# Patient Record
Sex: Female | Born: 1980 | Race: White | Hispanic: No | Smoking: Current every day smoker
Health system: Southern US, Community
[De-identification: ages and names within clinical notes are randomized; demographics above are authoritative.]

## PROBLEM LIST (undated history)

## (undated) DIAGNOSIS — F419 Anxiety disorder, unspecified: Secondary | ICD-10-CM

## (undated) DIAGNOSIS — E119 Type 2 diabetes mellitus without complications: Secondary | ICD-10-CM

## (undated) DIAGNOSIS — E7800 Pure hypercholesterolemia, unspecified: Secondary | ICD-10-CM

## (undated) HISTORY — DX: Pure hypercholesterolemia, unspecified: E78.00

## (undated) HISTORY — DX: Anxiety disorder, unspecified: F41.9

## (undated) HISTORY — DX: Type 2 diabetes mellitus without complications: E11.9

---

## 2008-04-03 HISTORY — PX: DILATION AND CURETTAGE OF UTERUS: SHX78

## 2009-04-03 DIAGNOSIS — IMO0002 Reserved for concepts with insufficient information to code with codable children: Secondary | ICD-10-CM

## 2010-04-03 HISTORY — PX: WISDOM TOOTH EXTRACTION: SHX21

## 2015-05-26 ENCOUNTER — Ambulatory Visit (INDEPENDENT_AMBULATORY_CARE_PROVIDER_SITE_OTHER): Payer: Medicaid Other | Admitting: Obstetrics and Gynecology

## 2015-05-26 ENCOUNTER — Encounter: Payer: Self-pay | Admitting: Obstetrics and Gynecology

## 2015-05-26 VITALS — BP 115/74 | HR 102 | Ht 62.0 in | Wt 183.3 lb

## 2015-05-26 DIAGNOSIS — Z36 Encounter for antenatal screening of mother: Secondary | ICD-10-CM | POA: Diagnosis not present

## 2015-05-26 DIAGNOSIS — E669 Obesity, unspecified: Secondary | ICD-10-CM

## 2015-05-26 DIAGNOSIS — Z98891 History of uterine scar from previous surgery: Secondary | ICD-10-CM | POA: Insufficient documentation

## 2015-05-26 DIAGNOSIS — E1110 Type 2 diabetes mellitus with ketoacidosis without coma: Secondary | ICD-10-CM | POA: Insufficient documentation

## 2015-05-26 DIAGNOSIS — Z720 Tobacco use: Secondary | ICD-10-CM

## 2015-05-26 DIAGNOSIS — Z3687 Encounter for antenatal screening for uncertain dates: Secondary | ICD-10-CM

## 2015-05-26 DIAGNOSIS — Z3490 Encounter for supervision of normal pregnancy, unspecified, unspecified trimester: Secondary | ICD-10-CM

## 2015-05-26 DIAGNOSIS — E1165 Type 2 diabetes mellitus with hyperglycemia: Secondary | ICD-10-CM | POA: Diagnosis not present

## 2015-05-26 DIAGNOSIS — IMO0001 Reserved for inherently not codable concepts without codable children: Secondary | ICD-10-CM

## 2015-05-26 DIAGNOSIS — N926 Irregular menstruation, unspecified: Secondary | ICD-10-CM | POA: Diagnosis not present

## 2015-05-26 DIAGNOSIS — E109 Type 1 diabetes mellitus without complications: Secondary | ICD-10-CM

## 2015-05-26 DIAGNOSIS — Z331 Pregnant state, incidental: Secondary | ICD-10-CM

## 2015-05-26 LAB — POCT URINE PREGNANCY: Preg Test, Ur: POSITIVE — AB

## 2015-05-26 NOTE — Progress Notes (Signed)
Chief complaint: 1. Pregnancy confirmation  The patient is a 35 year old single white female gravida 3 para 1011, uncertain last menstrual period 03/31/2015 (light), EDD 01/05/2016, EGA 8.0 weeks by uncertain LMP, presents for pregnancy confirmation and establishment of care.  Patient was not attempting conception; using condoms for birth control. Pregnancy is unplanned but desired. Father of baby is supportive; father of baby of first child is deceased.  Past obstetric history: G1- SAB requiring D&C G2-primary low transverse C-section due to nonreassuring fetal heart rate tracing; patient gained 100 pounds with that pregnancy; delivery was in [place] G3-current  Patient was diagnosed with the insulin requiring diabetes mellitus at age 48. Current dosing regimen is Novolin 70/3060 units per day; Novolin R 20-40 units per day with meals; blood sugars have been running in the low 300s   Past Medical History  Diagnosis Date  . Diabetes mellitus without complication (HCC)   . Hypercholesteremia   . Anxiety    Past Surgical History  Procedure Laterality Date  . Dilation and curettage of uterus  2010  . Cesarean section  2011  . Wisdom tooth extraction  2012    Family history: No history of colon cancer, ovarian cancer, breast cancer No history of genetic abnormalities in family No history of genetic abnormalities in father of baby family  Social history: Tobacco use 1 pack per day; decreased to one half pack per day following positive home pregnancy test Alcohol use- rare when nonpregnant Drug history-negative  OBJECTIVE: BP 115/74 mmHg  Pulse 102  Ht  (1.575 m)  Wt 183 lb 4.8 oz (83.144 kg)  BMI 33.52 kg/m2  LMP 03/31/2015 (Approximate) Physical exam deferred  ASSESSMENT: 1. Pregnancy, first trimester, unsure last menstrual period 2. History of cesarean section delivery for nonreassuring fetal heart rate tracing 3. Type 1 diabetes mellitus with uncontrolled  blood sugars; no recent hemoglobin A1c 4. Obesity 5. Tobacco user  PLAN: 1. Ultrasound ASAP to confirm viability and EDD 2. Pattern blood sugars daily; continue with insulin regimen until MFM consultation 3. Hemoglobin A1c today 4. Continue with decreasing tobacco use; pregnancy risks of tobacco use were reviewed 5. Maternal-fetal medicine consult ASAP following confirmation of fetal viability in order to optimize insulin therapy in pregnancy and co manage prenatal course. 6. Return in 2 weeks for new OB nursing intake, prenatal labs ([redacted] weeks gestation) 7. Return in approximate 4 weeks for new OB history and physical with provider ([redacted] weeks gestation) 8.New OB counseling:  The patient has been given an overview regarding routine prenatal care.  Recommendations regarding diet, weight gain, and exercise in pregnancy were given.  Prenatal testing, optional genetic testing, and ultrasound use in pregnancy were reviewed.   Benefits of Breast Feeding were discussed. The patient is encouraged to consider nursing her baby post partum. 9. Prenatal vitamins daily  A total of 30 minutes were spent face-to-face with the patient during the encounter with greater than 50% dealing with counseling and coordination of care.  Herold Harms, MD  Note: This dictation was prepared with Dragon dictation along with smaller phrase technology. Any transcriptional errors that result from this process are unintentional.

## 2015-05-26 NOTE — Patient Instructions (Signed)
PLAN: 1. Ultrasound ASAP to confirm viability and EDD 2. Pattern blood sugars daily; continue with insulin regimen until MFM consultation 3. Hemoglobin A1c today 4. Continue with decreasing tobacco use; pregnancy risks of tobacco use were reviewed 5. Maternal-fetal medicine consult ASAP following confirmation of fetal viability in order to optimize insulin therapy in pregnancy and co manage prenatal course. 6. Return in 2 weeks for new OB nursing intake, prenatal labs ([redacted] weeks gestation) 7. Return in approximate 4 weeks for new OB history and physical with provider ([redacted] weeks gestation) 8.New OB counseling:  The patient has been given an overview regarding routine prenatal care.  Recommendations regarding diet, weight gain, and exercise in pregnancy were given.  Prenatal testing, optional genetic testing, and ultrasound use in pregnancy were reviewed.   Benefits of Breast Feeding were discussed. The patient is encouraged to consider nursing her baby post partum. 9. Prenatal vitamins daily

## 2015-06-01 ENCOUNTER — Other Ambulatory Visit: Payer: Medicaid Other

## 2015-06-01 ENCOUNTER — Ambulatory Visit (INDEPENDENT_AMBULATORY_CARE_PROVIDER_SITE_OTHER): Payer: Medicaid Other

## 2015-06-01 DIAGNOSIS — Z36 Encounter for antenatal screening of mother: Secondary | ICD-10-CM | POA: Diagnosis not present

## 2015-06-01 DIAGNOSIS — Z3687 Encounter for antenatal screening for uncertain dates: Secondary | ICD-10-CM

## 2015-06-01 DIAGNOSIS — O24911 Unspecified diabetes mellitus in pregnancy, first trimester: Secondary | ICD-10-CM

## 2015-06-02 LAB — HEMOGLOBIN A1C
Est. average glucose Bld gHb Est-mCnc: 240 mg/dL
Hgb A1c MFr Bld: 10 % — ABNORMAL HIGH (ref 4.8–5.6)

## 2015-06-03 ENCOUNTER — Other Ambulatory Visit: Payer: Self-pay

## 2015-06-03 ENCOUNTER — Ambulatory Visit
Admission: RE | Admit: 2015-06-03 | Discharge: 2015-06-03 | Disposition: A | Discharge status: Home / Self Care | Payer: Medicaid Other | Source: Ambulatory Visit | Attending: Obstetrics and Gynecology | Admitting: Obstetrics and Gynecology

## 2015-06-03 VITALS — BP 128/71 | HR 91 | Temp 98.3°F | Resp 18 | Wt 182.6 lb

## 2015-06-03 DIAGNOSIS — Z3A09 9 weeks gestation of pregnancy: Secondary | ICD-10-CM | POA: Insufficient documentation

## 2015-06-03 DIAGNOSIS — O24011 Pre-existing diabetes mellitus, type 1, in pregnancy, first trimester: Secondary | ICD-10-CM | POA: Insufficient documentation

## 2015-06-03 DIAGNOSIS — Z79899 Other long term (current) drug therapy: Secondary | ICD-10-CM | POA: Insufficient documentation

## 2015-06-03 DIAGNOSIS — E1010 Type 1 diabetes mellitus with ketoacidosis without coma: Secondary | ICD-10-CM | POA: Diagnosis not present

## 2015-06-03 DIAGNOSIS — O09529 Supervision of elderly multigravida, unspecified trimester: Secondary | ICD-10-CM | POA: Insufficient documentation

## 2015-06-03 DIAGNOSIS — O09521 Supervision of elderly multigravida, first trimester: Secondary | ICD-10-CM

## 2015-06-03 DIAGNOSIS — E10311 Type 1 diabetes mellitus with unspecified diabetic retinopathy with macular edema: Secondary | ICD-10-CM

## 2015-06-03 DIAGNOSIS — Z794 Long term (current) use of insulin: Secondary | ICD-10-CM | POA: Insufficient documentation

## 2015-06-03 DIAGNOSIS — O24911 Unspecified diabetes mellitus in pregnancy, first trimester: Secondary | ICD-10-CM

## 2015-06-03 DIAGNOSIS — O99331 Smoking (tobacco) complicating pregnancy, first trimester: Secondary | ICD-10-CM | POA: Insufficient documentation

## 2015-06-03 LAB — COMPREHENSIVE METABOLIC PANEL
ALT: 15 U/L (ref 14–54)
AST: 15 U/L (ref 15–41)
Albumin: 4.1 g/dL (ref 3.5–5.0)
Alkaline Phosphatase: 90 U/L (ref 38–126)
Anion gap: 8 (ref 5–15)
BUN: 9 mg/dL (ref 6–20)
CHLORIDE: 105 mmol/L (ref 101–111)
CO2: 23 mmol/L (ref 22–32)
CREATININE: 0.48 mg/dL (ref 0.44–1.00)
Calcium: 8.7 mg/dL — ABNORMAL LOW (ref 8.9–10.3)
GFR calc non Af Amer: >60 mL/min (ref >60)
Glucose, Bld: 130 mg/dL — ABNORMAL HIGH (ref 65–99)
Potassium: 3.7 mmol/L (ref 3.5–5.1)
SODIUM: 136 mmol/L (ref 135–145)
Total Bilirubin: 0.6 mg/dL (ref 0.3–1.2)
Total Protein: 7 g/dL (ref 6.5–8.1)

## 2015-06-03 LAB — PROTEIN / CREATININE RATIO, URINE
Creatinine, Urine: 159 mg/dL
PROTEIN CREATININE RATIO: 0.08 mg/mg{creat} (ref 0.00–0.15)
Total Protein, Urine: 12 mg/dL

## 2015-06-03 LAB — CBC
HEMATOCRIT: 39.7 % (ref 35.0–47.0)
Hemoglobin: 13.8 g/dL (ref 12.0–16.0)
MCH: 31.1 pg (ref 26.0–34.0)
MCHC: 34.7 g/dL (ref 32.0–36.0)
MCV: 89.6 fL (ref 80.0–100.0)
PLATELETS: 320 10*3/uL (ref 150–440)
RBC: 4.43 MIL/uL (ref 3.80–5.20)
RDW: 13.1 % (ref 11.5–14.5)
WBC: 10.6 10*3/uL (ref 3.6–11.0)

## 2015-06-03 LAB — DIFFERENTIAL
BASOS PCT: 0 %
Basophils Absolute: 0 10*3/uL (ref 0–0.1)
EOS ABS: 0.2 10*3/uL (ref 0–0.7)
EOS PCT: 2 %
Lymphocytes Relative: 37 %
Lymphs Abs: 4 10*3/uL — ABNORMAL HIGH (ref 1.0–3.6)
MONO ABS: 0.6 10*3/uL (ref 0.2–0.9)
MONOS PCT: 6 %
Neutro Abs: 5.8 10*3/uL (ref 1.4–6.5)
Neutrophils Relative %: 55 %

## 2015-06-03 MED ORDER — INSULIN LISPRO 100 UNIT/ML ~~LOC~~ SOLN: 10.0000 [IU] | Freq: Three times a day (TID) | SUBCUTANEOUS | Status: DC

## 2015-06-03 MED ORDER — INSULIN GLARGINE 100 UNIT/ML ~~LOC~~ SOLN: 40.0000 [IU] | Freq: Every day | SUBCUTANEOUS | Status: DC

## 2015-06-03 MED ORDER — GLUCOSE BLOOD VI STRP: ORAL_STRIP | Status: DC

## 2015-06-03 NOTE — Progress Notes (Signed)
[facility] Consultation   Chief Complaint: Diabetes in pregnancy  HPI: Danielle Marks is a 35 y.o. G3P0011 at [id number] [facility]15[facility]ertain LMP) who presents in consultation from [facility] for recommendations regarding diabetes management.  Danielle Marks had diabetes since age [age]ut has not had regular care and only recently was started on Novolin 70/30 and Regular.  She has never seen an endocrinologist and her last A1C was a few years ago.  [place]1C at first prenatal appointment was 10.0.  She has not been taking her BS regularly.  She needs more test strips.  Past Medical History: Patient  has a past medical history of Diabetes mellitus without complication ([facility] Hypercholesteremia; and Anxiety. She denies having had Chicken pox. Past Surgical History: She  has past surgical history that includes Dilation and curettage of uterus (2010); Cesarean section (2011); and Wisdom tooth extraction (2012).   Obstetric History:  OB History    Gravida Para Term Preterm AB TAB SAB Ectopic Multiple Living   _0 2010 9wk miscarriage, D&C 2011 full term delivery via c/s for nonreassuring fetal status, female, 7#5oz, required 2wk NICU stay due to infection and feeding issues.  Gained 100# this pregnancy!  Was on insulin but can't remember regimen. 2017 current First 2 pregnancies were with her ex-husband  Gynecologic History:  Patient's last menstrual period was 03/31/2015 (approximate).   Hx of abnormal pap smears: no Last pap smear patient does not recall results of last pap and patient does not recall when last pap was (she has not had her NOB visit yet). She denies any STDs.  Medications:  Current Outpatient Prescriptions on File Prior to Encounter  Medication Sig Dispense Refill  . acetaminophen (TYLENOL) 325 MG tablet Take 650 mg by mouth every 6 (six) hours as  needed.    . insulin NPH-regular Human (NOVOLIN 70/30) (70-30) 100 UNIT/ML injection Inject 30 Units into the skin 2 (two) times daily with a meal.     . insulin regular (NOVOLIN R) 100 units/mL injection Inject into the skin 3 (three) times daily before meals.    . prenatal vitamin w/FE, FA (PRENATAL 1 + 1) 27-1 MG TABS tablet Take 1 tablet by mouth daily at 12 noon.      Allergies: Patient is allergic to ciprofloxacin. (anaphylaxis) Social History: Patient  reports that she has been smoking Cigarettes.  She has been smoking about 0.50 packs per day. She does not have any smokeless tobacco history on file. She reports that she does not drink alcohol or use illicit drugs.  Family History: family history includes Diabetes in her paternal grandmother. There is no history of Cancer or Heart disease. Father died by suicide, Mother has alcoholism, Sister with anorexia. Review of Systems A full 12 point review of systems was negative or as noted in the History of Present Illness.  Physical Exam: BP 128/71 mmHg  Pulse 91  Temp(Src) 98.3 F (36.8 C) (Oral)  Resp 18  Wt 182 lb 9.6 oz (82.827 kg)  SpO2 98%  LMP 03/31/2015 (Approximate) General appearance - alert, well appearing, and in no distress Fasting BS was 177 this am   Asessement: 1 Diabetes, poorly controlled, with A1C of 10  2. Elderly multigravida with antepartum condition or complication, first trimester   3.      Smoker -  84yrhistory, currently at 1/2 PPD 4.      Never had chicken pox  Plan:  We spent 30 minutes with Ms. Danielle Shankleof which more than 50% was counseling and coordinating care. We discussed the effects of her diabetes on the pregnancy and the fetus as well as the pregnancy effects on her diabetes. We reviewed the increased risk of congenital anomalies, fetal growth disturbances, fetal death, and neonatal metabolic complications and the importance of good glucose control to minimize these risks. Smoking increases  all of these complications.  We discussed increasing insulin requirements with advancing pregnancy and the need for frequent adjustment of insulin dosages to meet these needs. We cautioned her about over control and the need to avoid hypoglycemia. She was referred to [facility] for a 2100-2200 calorie meal plan and information about carbohydrate and protein requirements in pregnancy.   I prescribed her a Lantus/Humalog regimen and we discussed the importance of regular meals and bedtime snack.  Will start 40U Lantus qhs and 10U Humalog with meals.  She is aware that this is likely an underestimate based on her current 70/30 and R regimen and will need to be increased steadily until her goals (see below) are met.    The goals of her insulin regimen is to have: 1. Fasting a capillary blood glucose between 60 and 95 mg/dl; 2. Two hour postprandial capillary blood glucoses less than 120 mg/dl.  In addition, we have the following recommendations:  1. Baseline EKG; 2. Protein/creatinine ratio, CMP, TFTs and cbc; 3. Ophthalmological examination; 4. Baby ASA 5. First trimester fetal assessment of anatomy and nuchal translucency area; 6. Fetal anatomic survey between 16-[redacted] weeks gestation (Please let [place] if you would like [place]ato schedule this.); 7 Fetal cardiac evaluation between 22-[redacted] weeks gestation;  8 Ultrasound for fetal growth every 4 weeks beginning at [redacted] weeks gestation;  9. Twice weekly nonstress tests beginning at [redacted] weeks gestation  10. Delivery planning will be determined once the patient is closer to her estimated due date. This will be determined by:    A. Fetal size;    B. Amniotic fluid volume;    C. Level of glucose control;    D. Other medical or obstetrical complications.  She was offered and declined formal genetic counseling but [facility] would be a candidate for first trimester screening or cell free fetal DNA as well as diagnostic procedures (CVS or  amniocentesis) if desired.  She was strongly encouraged to cut down or quit, if possible, smoking given the concomitant risks of smoking and diabetes on both maternal and fetal health.  Consider checking for prior exposure to Varicella with NOB labs.  I have taken the liberty of scheduling her a follow up consult in 1 week to review her BS as well as a 12 week first trimester anatomy [place]  If it is preferred that this if performed in the office, please feel free to cancel the latter appointment.  We appreciate the opportunity to take part in the care of Danielle Marks. Please don't hesitate to contact u[place]ith any questions.  SWynona Neat MD Maternal-Fetal Medicine

## 2015-06-03 NOTE — Progress Notes (Signed)
Patient called to state that the Lantus/Humalog was too expensive to get filled.  Should have Pregnancy Medicaid within the week and will retry to fill it then.  In the meantime, will increase her 70/30 to 34 in the am and 34 in the pm and she was encouraged to check in BS 4x daily so that we can make appropriate adjustments.  Referral to [facility] was placed.  Has f/up scheduled in 1 week to review labs and BS log.

## 2015-06-03 NOTE — Addendum Note (Signed)
Encounter addended by: Kirby Funk, MD on: 06/03/2015  4:20 PM<BR>     Documentation filed: Notes Section

## 2015-06-04 LAB — THYROID PANEL WITH TSH
FREE THYROXINE INDEX: 1.8 (ref 1.2–4.9)
T3 UPTAKE RATIO: 23 % — AB (ref 24–39)
T4 TOTAL: 7.8 ug/dL (ref 4.5–12.0)
TSH: 0.737 u[IU]/mL (ref 0.450–4.500)

## 2015-06-07 ENCOUNTER — Telehealth: Payer: Self-pay

## 2015-06-10 ENCOUNTER — Other Ambulatory Visit: Payer: Self-pay | Admitting: Obstetrics & Gynecology

## 2015-06-10 ENCOUNTER — Ambulatory Visit (INDEPENDENT_AMBULATORY_CARE_PROVIDER_SITE_OTHER): Payer: Medicaid Other | Admitting: Obstetrics and Gynecology

## 2015-06-10 ENCOUNTER — Ambulatory Visit: Payer: Self-pay

## 2015-06-10 ENCOUNTER — Inpatient Hospital Stay: Admission: RE | Admit: 2015-06-10 | Payer: Self-pay | Source: Ambulatory Visit

## 2015-06-10 VITALS — BP 110/80 | HR 97 | Wt 186.2 lb

## 2015-06-10 DIAGNOSIS — Z113 Encounter for screening for infections with a predominantly sexual mode of transmission: Secondary | ICD-10-CM

## 2015-06-10 DIAGNOSIS — O09521 Supervision of elderly multigravida, first trimester: Secondary | ICD-10-CM

## 2015-06-10 DIAGNOSIS — Z331 Pregnant state, incidental: Secondary | ICD-10-CM

## 2015-06-10 DIAGNOSIS — E10311 Type 1 diabetes mellitus with unspecified diabetic retinopathy with macular edema: Secondary | ICD-10-CM

## 2015-06-10 DIAGNOSIS — Z1389 Encounter for screening for other disorder: Secondary | ICD-10-CM

## 2015-06-10 DIAGNOSIS — Z369 Encounter for antenatal screening, unspecified: Secondary | ICD-10-CM

## 2015-06-10 DIAGNOSIS — Z3490 Encounter for supervision of normal pregnancy, unspecified, unspecified trimester: Secondary | ICD-10-CM

## 2015-06-10 DIAGNOSIS — Z36 Encounter for antenatal screening of mother: Secondary | ICD-10-CM

## 2015-06-10 MED ORDER — GLUCOSE BLOOD VI STRP: ORAL_STRIP | Status: DC

## 2015-06-10 MED ORDER — INSULIN LISPRO 100 UNIT/ML ~~LOC~~ SOLN: 10.0000 [IU] | Freq: Three times a day (TID) | SUBCUTANEOUS | Status: DC

## 2015-06-10 MED ORDER — GLUCOSE BLOOD VI STRP: ORAL_STRIP | Status: AC

## 2015-06-10 NOTE — Patient Instructions (Signed)
Pregnancy and Zika Virus Disease Zika virus disease, or Zika, is an illness that can spread to people from mosquitoes that carry the virus. It may also spread from person to person through infected body fluids. Zika first occurred in [place], but recently it has spread to new areas. The virus occurs in tropical climates. The location of Zika continues to change. Most people who become infected with Zika virus do not develop serious illness. However, Zika may cause birth defects in an unborn baby whose mother is infected with the virus. It may also increase the risk of miscarriage. WHAT ARE THE SYMPTOMS OF ZIKA VIRUS DISEASE? In many cases, people who have been infected with Zika virus do not develop any symptoms. If symptoms appear, they usually start about a week after the person is infected. Symptoms are usually mild. They may include:  Fever.  Rash.  Red eyes.  Joint pain. HOW DOES ZIKA VIRUS DISEASE SPREAD? The main way that Zika virus spreads is through the bite of a certain type of mosquito. Unlike most types of mosquitos, which bite only at night, the type of mosquito that carries Zika virus bites both at night and during the day. Zika virus can also spread through sexual contact, through a blood transfusion, and from a mother to her baby before or during birth. Once you have had Zika virus disease, it is unlikely that you will get it again. CAN I PASS ZIKA TO MY BABY DURING PREGNANCY? Yes, Zika can pass from a mother to her baby before or during birth. WHAT PROBLEMS CAN ZIKA CAUSE FOR MY BABY? A woman who is infected with Zika virus while pregnant is at risk of having her baby born with a condition in which the brain or head is smaller than expected (microcephaly). Babies who have microcephaly can have developmental delays, seizures, hearing problems, and vision problems. Having Zika virus disease during pregnancy can also increase the risk of miscarriage. HOW CAN ZIKA VIRUS DISEASE BE  PREVENTED? There is no vaccine to prevent Zika. The best way to prevent the disease is to avoid infected mosquitoes and avoid exposure to body fluids that can spread the virus. Avoid any possible exposure to Zika by taking the following precautions. For women and their sex partners:  Avoid traveling to high-risk areas. The locations where Zika is being reported change often. To identify high-risk areas, check the CDC travel website: www.cdc.gov/zika/geo/index.html  If you or your sex partner must travel to a high-risk area, talk with a health care provider before and after traveling.  Take all precautions to avoid mosquito bites if you live in, or travel to, any of the high-risk areas. Insect repellents are safe to use during pregnancy.  Ask your health care provider when it is safe to have sexual contact. For women:  If you are pregnant or trying to become pregnant, avoid sexual contact with persons who may have been exposed to Zika virus, persons who have possible symptoms of Zika, or persons whose history you are unsure about. If you choose to have sexual contact with someone who may have been exposed to Zika virus, use condoms correctly during the entire duration of sexual activity, every time. Do not share sexual devices, as you may be exposed to body fluids.  Ask your health care provider about when it is safe to attempt pregnancy after a possible exposure to Zika virus. WHAT STEPS SHOULD I TAKE TO AVOID MOSQUITO BITES? Take these steps to avoid mosquito bites when you are   in a high-risk area:  Wear loose clothing that covers your arms and legs.  Limit your outdoor activities.  Do not open windows unless they have window screens.  Sleep under mosquito nets.  Use insect repellent. The best insect repellents have:  DEET, picaridin, oil of lemon eucalyptus (OLE), or [id number] in them.  Higher amounts of an active ingredient in them.  Remember that insect repellents are safe to use  during pregnancy.  Do not use OLE on children who are younger than 3 years of age. Do not use insect repellent on babies who are younger than 2 months of age.  Cover your child's stroller with mosquito netting. Make sure the netting fits snugly and that any loose netting does not cover your child's mouth or nose. Do not use a blanket as a mosquito-protection cover.  Do not apply insect repellent underneath clothing.  If you are using sunscreen, apply the sunscreen before applying the insect repellent.  Treat clothing with permethrin. Do not apply permethrin directly to your skin. Follow label directions for safe use.  Get rid of standing water, where mosquitoes may reproduce. Standing water is often found in items such as buckets, bowls, animal food dishes, and flowerpots. When you return from traveling to any high-risk area, continue taking actions to protect yourself against mosquito bites for 3 weeks, even if you show no signs of illness. This will prevent spreading Zika virus to uninfected mosquitoes. WHAT SHOULD I KNOW ABOUT THE SEXUAL TRANSMISSION OF ZIKA? People can spread Zika to their sexual partners during vaginal, anal, or oral sex, or by sharing sexual devices. Many people with Zika do not develop symptoms, so a person could spread the disease without knowing that they are infected. The greatest risk is to women who are pregnant or who may become pregnant. Zika virus can live longer in semen than it can live in blood. Couples can prevent sexual transmission of the virus by:  Using condoms correctly during the entire duration of sexual activity, every time. This includes vaginal, anal, and oral sex.  Not sharing sexual devices. Sharing increases your risk of being exposed to body fluid from another person.  Avoiding all sexual activity until your health care provider says it is safe. SHOULD I BE TESTED FOR ZIKA VIRUS? A sample of your blood can be tested for Zika virus. A pregnant  woman should be tested if she may have been exposed to the virus or if she has symptoms of Zika. She may also have additional tests done during her pregnancy, such ultrasound testing. Talk with your health care provider about which tests are recommended.   This information is not intended to replace advice given to you by your health care provider. Make sure you discuss any questions you have with your health care provider.   Document Released: 12/09/2014 Document Reviewed: 12/02/2014 Elsevier Interactive Patient Education 2016 Elsevier Inc. Minor Illnesses and Medications in Pregnancy  Cold/Flu:  Sudafed for congestion- Robitussin (plain) for cough- Tylenol for discomfort.  Please follow the directions on the label.  Try not to take any more than needed.  OTC Saline nasal spray and air humidifier or cool-mist  Vaporizer to sooth nasal irritation and to loosen congestion.  It is also important to increase intake of non carbonated fluids, especially if you have a fever.  Constipation:  Colace-2 capsules at bedtime; Metamucil- follow directions on label; Senokot- 1 tablet at bedtime.  Any one of these medications can be used.  It is also   very important to increase fluids and fruits along with regular exercise.  If problem persists please call the office.  Diarrhea:  Kaopectate as directed on the label.  Eat a bland diet and increase fluids.  Avoid highly seasoned foods.  Headache:  Tylenol 1 or 2 tablets every 3-4 hours as needed  Indigestion:  Maalox, Mylanta, Tums or Rolaids- as directed on label.  Also try to eat small meals and avoid fatty, greasy or spicy foods.  Nausea with or without Vomiting:  Nausea in pregnancy is caused by increased levels of hormones in the body which influence the digestive system and cause irritation when stomach acids accumulate.  Symptoms usually subside after 1st trimester of pregnancy.  Try the following:  Keep saltines, graham crackers or dry toast by your bed  to eat upon awakening.  Don't let your stomach get empty.  Try to eat 5-6 small meals per day instead of 3 large ones.  Avoid greasy fatty or highly seasoned foods.   Take OTC Unisom 1 tablet at bed time along with OTC Vitamin B6 25-50 mg 3 times per day.    If nausea continues with vomiting and you are unable to keep down food and fluids you may need a prescription medication.  Please notify your provider.   Sore throat:  Chloraseptic spray, throat lozenges and or plain Tylenol.  Vaginal Yeast Infection:  OTC Monistat for 7 days as directed on label.  If symptoms do not resolve within a week notify provider.  If any of the above problems do not subside with recommended treatment please call the office for further assistance.   Do not take Aspirin, Advil, Motrin or Ibuprofen.  * * OTC= Over the counter Hyperemesis Gravidarum Hyperemesis gravidarum is a severe form of nausea and vomiting that happens during pregnancy. Hyperemesis is worse than morning sickness. It may cause you to have nausea or vomiting all day for many days. It may keep you from eating and drinking enough food and liquids. Hyperemesis usually occurs during the first half (the first 20 weeks) of pregnancy. It often goes away once a woman is in her second half of pregnancy. However, sometimes hyperemesis continues through an entire pregnancy.  CAUSES  The cause of this condition is not completely known but is thought to be related to changes in the body's hormones when pregnant. It could be from the high level of the pregnancy hormone or an increase in estrogen in the body.  SIGNS AND SYMPTOMS   Severe nausea and vomiting.  Nausea that does not go away.  Vomiting that does not allow you to keep any food down.  Weight loss and body fluid loss (dehydration).  Having no desire to eat or not liking food you have previously enjoyed. DIAGNOSIS  Your health care provider will do a physical exam and ask you about your symptoms.  He or she may also order blood tests and urine tests to make sure something else is not causing the problem.  TREATMENT  You may only need medicine to control the problem. If medicines do not control the nausea and vomiting, you will be treated in the hospital to prevent dehydration, increased acid in the blood (acidosis), weight loss, and changes in the electrolytes in your body that may harm the unborn baby (fetus). You may need IV fluids.  HOME CARE INSTRUCTIONS   Only take over-the-counter or prescription medicines as directed by your health care provider.  Try eating a couple of dry crackers or   toast in the morning before getting out of bed.  Avoid foods and smells that upset your stomach.  Avoid fatty and spicy foods.  Eat 5-6 small meals a day.  Do not drink when eating meals. Drink between meals.  For snacks, eat high-protein foods, such as cheese.  Eat or suck on things that have ginger in them. Ginger helps nausea.  Avoid food preparation. The smell of food can spoil your appetite.  Avoid iron pills and iron in your multivitamins until after 3-4 months of being pregnant. However, consult with your health care provider before stopping any prescribed iron pills. SEEK MEDICAL CARE IF:   Your abdominal pain increases.  You have a severe headache.  You have vision problems.  You are losing weight. SEEK IMMEDIATE MEDICAL CARE IF:   You are unable to keep fluids down.  You vomit blood.  You have constant nausea and vomiting.  You have excessive weakness.  You have extreme thirst.  You have dizziness or fainting.  You have a fever or persistent symptoms for more than 2-3 days.  You have a fever and your symptoms suddenly get worse. MAKE SURE YOU:   Understand these instructions.  Will watch your condition.  Will get help right away if you are not doing well or get worse.   This information is not intended to replace advice given to you by your health care  provider. Make sure you discuss any questions you have with your health care provider.   Document Released: 03/20/2005 Document Revised: 01/08/2013 Document Reviewed: 10/30/2012 Elsevier Interactive Patient Education 2016 Elsevier Inc. Commonly Asked Questions During Pregnancy  Cats: A parasite can be excreted in cat feces.  To avoid exposure you need to have another person empty the little box.  If you must empty the litter box you will need to wear gloves.  Wash your hands after handling your cat.  This parasite can also be found in raw or undercooked meat so this should also be avoided.  Colds, Sore Throats, Flu: Please check your medication sheet to see what you can take for symptoms.  If your symptoms are unrelieved by these medications please call the office.  Dental Work: Most any dental work your dentist recommends is permitted.  X-rays should only be taken during the first trimester if absolutely necessary.  Your abdomen should be shielded with a lead apron during all x-rays.  Please notify your provider prior to receiving any x-rays.  Novocaine is fine; gas is not recommended.  If your dentist requires a note from us prior to dental work please call the office and we will provide one for you.  Exercise: Exercise is an important part of staying healthy during your pregnancy.  You may continue most exercises you were accustomed to prior to pregnancy.  Later in your pregnancy you will most likely notice you have difficulty with activities requiring balance like riding a bicycle.  It is important that you listen to your body and avoid activities that put you at a higher risk of falling.  Adequate rest and staying well hydrated are a must!  If you have questions about the safety of specific activities ask your provider.    Exposure to Children with illness: Try to avoid obvious exposure; report any symptoms to us when noted,  If you have chicken pos, red measles or mumps, you should be immune to  these diseases.   Please do not take any vaccines while pregnant unless you have checked with   your OB provider.  Fetal Movement: After 28 weeks we recommend you do "kick counts" twice daily.  Lie or sit down in a calm quiet environment and count your baby movements "kicks".  You should feel your baby at least 10 times per hour.  If you have not felt 10 kicks within the first hour get up, walk around and have something sweet to eat or drink then repeat for an additional hour.  If count remains less than 10 per hour notify your provider.  Fumigating: Follow your pest control agent's advice as to how long to stay out of your home.  Ventilate the area well before re-entering.  Hemorrhoids:   Most over-the-counter preparations can be used during pregnancy.  Check your medication to see what is safe to use.  It is important to use a stool softener or fiber in your diet and to drink lots of liquids.  If hemorrhoids seem to be getting worse please call the office.   Hot Tubs:  Hot tubs Jacuzzis and saunas are not recommended while pregnant.  These increase your internal body temperature and should be avoided.  Intercourse:  Sexual intercourse is safe during pregnancy as long as you are comfortable, unless otherwise advised by your provider.  Spotting may occur after intercourse; report any bright red bleeding that is heavier than spotting.  Labor:  If you know that you are in labor, please go to the hospital.  If you are unsure, please call the office and let us help you decide what to do.  Lifting, straining, etc:  If your job requires heavy lifting or straining please check with your provider for any limitations.  Generally, you should not lift items heavier than that you can lift simply with your hands and arms (no back muscles)  Painting:  Paint fumes do not harm your pregnancy, but may make you ill and should be avoided if possible.  Latex or water based paints have less odor than oils.  Use adequate  ventilation while painting.  Permanents & Hair Color:  Chemicals in hair dyes are not recommended as they cause increase hair dryness which can increase hair loss during pregnancy.  " Highlighting" and permanents are allowed.  Dye may be absorbed differently and permanents may not hold as well during pregnancy.  Sunbathing:  Use a sunscreen, as skin burns easily during pregnancy.  Drink plenty of fluids; avoid over heating.  Tanning Beds:  Because their possible side effects are still unknown, tanning beds are not recommended.  Ultrasound Scans:  Routine ultrasounds are performed at approximately 20 weeks.  You will be able to see your baby's general anatomy an if you would like to know the gender this can usually be determined as well.  If it is questionable when you conceived you may also receive an ultrasound early in your pregnancy for dating purposes.  Otherwise ultrasound exams are not routinely performed unless there is a medical necessity.  Although you can request a scan we ask that you pay for it when conducted because insurance does not cover " patient request" scans.  Work: If your pregnancy proceeds without complications you may work until your due date, unless your physician or employer advises otherwise.  Round Ligament Pain/Pelvic Discomfort:  Sharp, shooting pains not associated with bleeding are fairly common, usually occurring in the second trimester of pregnancy.  They tend to be worse when standing up or when you remain standing for long periods of time.  These are the result   of pressure of certain pelvic ligaments called "round ligaments".  Rest, Tylenol and heat seem to be the most effective relief.  As the womb and fetus grow, they rise out of the pelvis and the discomfort improves.  Please notify the office if your pain seems different than that described.  It may represent a more serious condition.   

## 2015-06-10 NOTE — Progress Notes (Signed)
Danielle Marks: Pt was schedule today to review her sugar log after starting new insulin regimen of Lantus/Humalog. She presents and states that [facility] cancelled the order so she has not yet started the regimen. We re-odered her insulin and test strips today and rescheduled her appt for next week to review her log  Ebany Bowermaster, [place] A, MD

## 2015-06-10 NOTE — Progress Notes (Signed)
Kizzie FurnishChristina Gropp presents for NOB nurse interview visit. G-1.  [id number]. Ultrasound done 06/01/2015 with EDD: 01/05/2016.  Pregnancy education material explained and given. No cats in the home. NOB labs ordered.  HIV labs and Drug screen were explained optional and she could opt out of tests but did not decline. Drug screen ordered. PNV encouraged. [facility]. Pt will check on this. [facility]. Pt doses were recently changed on her insulin but there was a pharmacy issue and she will talk today with them about it.  Pt also to contact [facility] for an appt. Pt. To follow up with provider in 1 weeks for NOB physical as ordered.  All questions answered.    ZIKA EXPOSURE SCREEN:  The patient has not traveled to a [place] endemic area within the past 6 months, nor has she had unprotected sex with a partner who has travelled to a [place] endemic region within the past 6 months. The patient has been advised to notify us if these factors change any time during this current pregnancy, so adequate testing and monitoring can be initiated.

## 2015-06-11 LAB — CBC WITH DIFFERENTIAL/PLATELET
BASOS: 0 %
Basophils Absolute: 0 10*3/uL (ref 0.0–0.2)
EOS (ABSOLUTE): 0.2 10*3/uL (ref 0.0–0.4)
EOS: 2 %
HEMATOCRIT: 40.1 % (ref 34.0–46.6)
HEMOGLOBIN: 13.9 g/dL (ref 11.1–15.9)
Immature Grans (Abs): 0 10*3/uL (ref 0.0–0.1)
Immature Granulocytes: 0 %
LYMPHS ABS: 2.9 10*3/uL (ref 0.7–3.1)
Lymphs: 31 %
MCH: 30.8 pg (ref 26.6–33.0)
MCHC: 34.7 g/dL (ref 31.5–35.7)
MCV: 89 fL (ref 79–97)
MONOCYTES: 6 %
MONOS ABS: 0.5 10*3/uL (ref 0.1–0.9)
NEUTROS ABS: 5.8 10*3/uL (ref 1.4–7.0)
Neutrophils: 61 %
Platelets: 371 10*3/uL (ref 150–379)
RBC: 4.52 x10E6/uL (ref 3.77–5.28)
RDW: 14 % (ref 12.3–15.4)
WBC: 9.5 10*3/uL (ref 3.4–10.8)

## 2015-06-11 LAB — HIV ANTIBODY (ROUTINE TESTING W REFLEX): HIV Screen 4th Generation wRfx: NONREACTIVE

## 2015-06-11 LAB — RUBELLA ANTIBODY, IGM: Rubella IgM: <20 AU/mL (ref 0.0–19.9)

## 2015-06-11 LAB — HEPATITIS B SURFACE ANTIGEN: Hepatitis B Surface Ag: NEGATIVE

## 2015-06-11 LAB — RH TYPE: RH TYPE: POSITIVE

## 2015-06-11 LAB — VARICELLA ZOSTER ANTIBODY, IGM: Varicella IgM: <0.91 index (ref 0.00–0.90)

## 2015-06-11 LAB — RPR: RPR: NONREACTIVE

## 2015-06-11 LAB — GC/CHLAMYDIA PROBE AMP
Chlamydia trachomatis, NAA: NEGATIVE
NEISSERIA GONORRHOEAE BY PCR: NEGATIVE

## 2015-06-11 LAB — ABO

## 2015-06-11 LAB — ANTIBODY SCREEN: Antibody Screen: NEGATIVE

## 2015-06-12 LAB — PAIN MGT SCRN (14 DRUGS), UR
AMPHETAMINE SCRN UR: NEGATIVE ng/mL
BARBITURATE SCRN UR: NEGATIVE ng/mL
BENZODIAZEPINE SCREEN, URINE: NEGATIVE ng/mL
Buprenorphine, Urine: NEGATIVE ng/mL
CANNABINOIDS UR QL SCN: NEGATIVE ng/mL
CREATININE(CRT), U: 42.1 mg/dL (ref 20.0–300.0)
Cocaine(Metab.)Screen, Urine: NEGATIVE ng/mL
FENTANYL, URINE: NEGATIVE pg/mL
MEPERIDINE SCREEN, URINE: NEGATIVE ng/mL
Methadone Scn, Ur: NEGATIVE ng/mL
OPIATE SCRN UR: NEGATIVE ng/mL
OXYCODONE+OXYMORPHONE UR QL SCN: NEGATIVE ng/mL
PCP Scrn, Ur: NEGATIVE ng/mL
PH UR, DRUG SCRN: 6.9 (ref 4.5–8.9)
Propoxyphene, Screen: NEGATIVE ng/mL
Tramadol Ur Ql Scn: NEGATIVE ng/mL

## 2015-06-12 LAB — URINALYSIS, ROUTINE W REFLEX MICROSCOPIC
BILIRUBIN UA: NEGATIVE
KETONES UA: NEGATIVE
Leukocytes, UA: NEGATIVE
NITRITE UA: NEGATIVE
PH UA: 7 (ref 5.0–7.5)
Protein, UA: NEGATIVE
RBC, UA: NEGATIVE
SPEC GRAV UA: 1.015 (ref 1.005–1.030)
UUROB: 0.2 mg/dL (ref 0.2–1.0)

## 2015-06-12 LAB — NICOTINE SCREEN, URINE: COTININE UR QL SCN: POSITIVE ng/mL

## 2015-06-12 LAB — CULTURE, OB URINE

## 2015-06-12 LAB — URINE CULTURE, OB REFLEX

## 2015-06-15 ENCOUNTER — Emergency Department
Admission: EM | Admit: 2015-06-15 | Discharge: 2015-06-15 | Disposition: A | Discharge status: Home / Self Care | Payer: Medicaid Other | Attending: Emergency Medicine | Admitting: Emergency Medicine

## 2015-06-15 ENCOUNTER — Encounter: Payer: Self-pay | Admitting: Emergency Medicine

## 2015-06-15 DIAGNOSIS — O24011 Pre-existing diabetes mellitus, type 1, in pregnancy, first trimester: Secondary | ICD-10-CM | POA: Diagnosis not present

## 2015-06-15 DIAGNOSIS — R350 Frequency of micturition: Secondary | ICD-10-CM | POA: Diagnosis not present

## 2015-06-15 DIAGNOSIS — Z794 Long term (current) use of insulin: Secondary | ICD-10-CM | POA: Insufficient documentation

## 2015-06-15 DIAGNOSIS — Z3A10 10 weeks gestation of pregnancy: Secondary | ICD-10-CM | POA: Diagnosis not present

## 2015-06-15 DIAGNOSIS — F17210 Nicotine dependence, cigarettes, uncomplicated: Secondary | ICD-10-CM | POA: Insufficient documentation

## 2015-06-15 DIAGNOSIS — J069 Acute upper respiratory infection, unspecified: Secondary | ICD-10-CM | POA: Diagnosis not present

## 2015-06-15 DIAGNOSIS — O99331 Smoking (tobacco) complicating pregnancy, first trimester: Secondary | ICD-10-CM | POA: Insufficient documentation

## 2015-06-15 DIAGNOSIS — O99511 Diseases of the respiratory system complicating pregnancy, first trimester: Secondary | ICD-10-CM | POA: Insufficient documentation

## 2015-06-15 LAB — URINALYSIS COMPLETE WITH MICROSCOPIC (ARMC ONLY)
BILIRUBIN URINE: NEGATIVE
Bacteria, UA: NONE SEEN
DYSMORPHIC RBC: 0
GLUCOSE, UA: NEGATIVE mg/dL
Hgb urine dipstick: NEGATIVE
Ketones, ur: NEGATIVE mg/dL
Leukocytes, UA: NEGATIVE
Nitrite: NEGATIVE
PH: 8 (ref 5.0–8.0)
Protein, ur: NEGATIVE mg/dL
RBC / HPF: 3 RBC/hpf (ref 0–5)
Specific Gravity, Urine: 1.013 (ref 1.005–1.030)
WBC, UA: NONE SEEN WBC/hpf (ref 0–5)

## 2015-06-15 LAB — RAPID INFLUENZA A&B ANTIGENS (ARMC ONLY)
INFLUENZA A (ARMC): NEGATIVE
INFLUENZA B (ARMC): NEGATIVE

## 2015-06-15 NOTE — ED Notes (Signed)
Fever and cough  for the past 2 days   She is 10 weeks preg

## 2015-06-15 NOTE — ED Notes (Signed)
Pregnnant.  Has cough and fever x 2 days.

## 2015-06-15 NOTE — Discharge Instructions (Signed)

## 2015-06-15 NOTE — ED Provider Notes (Signed)
[facility] Emergency Department Provider Note  ____________________________________________  Time seen: Approximately 9:10 AM  I have reviewed the triage vital signs and the nursing notes.   HISTORY  Chief Complaint Cough and Fever   HPI Danielle Marks is a 35 y.o. female is here with complaint of fever and cough for the last 2 days. Patient states that she is [redacted] weeks pregnant.Since she has been sick she has been unable to smoke cigarettes. Patient is not taking any over-the-counter medication due to the fact she is pregnant. She also thinks that "there is infection in my body". She states that she has been going to urinate more frequently but has no other symptoms.   Past Medical History  Diagnosis Date  . Diabetes mellitus without complication (HCC)   . Hypercholesteremia   . Anxiety     Patient Active Problem List   Diagnosis Date Noted  . Elderly multigravida with antepartum condition or complication 06/03/2015  . History of cesarean section 05/26/2015  . Obesity 05/26/2015  . Type 1 diabetes mellitus (HCC) 05/26/2015  . Tobacco user 05/26/2015    Past Surgical History  Procedure Laterality Date  . Dilation and curettage of uterus  2010  . Cesarean section  2011  . Wisdom tooth extraction  2012    Current Outpatient Rx  Name  Route  Sig  Dispense  Refill  . acetaminophen (TYLENOL) 325 MG tablet   Oral   Take 650 mg by mouth every 6 (six) hours as needed.         Marland Kitchen. glucose blood (COOL BLOOD GLUCOSE TEST STRIPS) test strip      Use as instructed Patient not taking: Reported on 06/10/2015   100 each   12   . insulin glargine (LANTUS) 100 UNIT/ML injection   Subcutaneous   Inject 0.4 mLs (40 Units total) into the skin at bedtime.   10 mL   11   . insulin lispro (HUMALOG) 100 UNIT/ML injection   Subcutaneous   Inject 0.1 mLs (10 Units total) into the skin 3 (three) times daily before meals.   10 mL   11   . prenatal vitamin  w/FE, FA (PRENATAL 1 + 1) 27-1 MG TABS tablet   Oral   Take 1 tablet by mouth daily at 12 noon. Reported on 06/10/2015           Allergies Ciprofloxacin  Family History  Problem Relation Age of Onset  . Diabetes Paternal Grandmother   . Cancer Neg Hx   . Heart disease Neg Hx     Social History Social History  Substance Use Topics  . Smoking status: Current Every Day Smoker -- 0.50 packs/day    Types: Cigarettes  . Smokeless tobacco: Never Used  . Alcohol Use: No    Review of Systems Constitutional: Positive fever/chills Eyes: No visual changes. ENT: No sore throat. Positive congestion Cardiovascular: Denies chest pain. Respiratory: Denies shortness of breath. Positive cough. Gastrointestinal: No abdominal pain.  No nausea, no vomiting.  No diarrhea.   Genitourinary: Positive for urinary frequency Musculoskeletal: Negative for back pain. Skin: Negative for rash. Neurological: Negative for headaches, focal weakness or numbness.  10-point ROS otherwise negative.  ____________________________________________   PHYSICAL EXAM:  VITAL SIGNS: ED Triage Vitals  Enc Vitals Group     BP 06/15/15 0738 118/58 mmHg     Pulse Rate 06/15/15 0738 108     Resp 06/15/15 0738 20     Temp 06/15/15 0738 98.6 F (  37 C)     Temp Source 06/15/15 0738 Oral     SpO2 06/15/15 0738 96 %     Weight 06/15/15 0738 180 lb (81.647 kg)     Height 06/15/15 0738  (1.575 m)     Head Cir --      Peak Flow --      Pain Score --      Pain Loc --      Pain Edu? --      Excl. in GC? --     Constitutional: Alert and oriented. Well appearing and in no acute distress. Eyes: Conjunctivae are normal. PERRL. EOMI. Head: Atraumatic. Nose: Mild congestion/rhinnorhea. EACs are clear bilaterally. TMs are dull without erythema or injection. Mouth/Throat: Mucous membranes are moist.  Oropharynx non-erythematous. Also posterior drainage. Neck: No stridor.   Supple Hematological/Lymphatic/Immunilogical: No cervical lymphadenopathy. Cardiovascular: Normal rate, regular rhythm. Grossly normal heart sounds.  Good peripheral circulation. Respiratory: Normal respiratory effort.  No retractions. Lungs CTAB. Musculoskeletal: His upper and lower extremities without any difficulty. Normal gait was noted. Neurologic:  Normal speech and language. No gross focal neurologic deficits are appreciated. No gait instability. Skin:  Skin is warm, dry and intact. No rash noted. Psychiatric: Mood and affect are normal. Speech and behavior are normal.  ____________________________________________   LABS (all labs ordered are listed, but only abnormal results are displayed)  Labs Reviewed  URINALYSIS COMPLETEWITH MICROSCOPIC ([facility] ONLY) - Abnormal; Notable for the following:    Color, Urine YELLOW (*)    APPearance CLEAR (*)    Squamous Epithelial / LPF 0-5 (*)    All other components within normal limits  RAPID INFLUENZA A&B ANTIGENS ([facility] ONLY)   RADIOLOGY  Deferred ____________________________________________   PROCEDURES  Procedure(s) performed: None  Critical Care performed: No  ____________________________________________   INITIAL IMPRESSION / ASSESSMENT AND PLAN / ED COURSE  Pertinent labs & imaging results that were available during my care of the patient were reviewed by me and considered in my medical decision making (see chart for details).  Patient is very disgruntled that she can only take over-the-counter medication while being pregnant. She states she is going to call her PCP to get an antibiotic. She is encouraged to drink fluids and continue taking Tylenol sparingly as needed for body aches. She was reassured she did not have influenza. ____________________________________________   FINAL CLINICAL IMPRESSION(S) / ED DIAGNOSES  Final diagnoses:  Viral upper respiratory illness      Tommi Rumps, PA-C 06/15/15  1030  Sharyn Creamer, MD 06/15/15 1502

## 2015-06-17 ENCOUNTER — Ambulatory Visit: Payer: Medicaid Other

## 2015-06-17 ENCOUNTER — Ambulatory Visit: Payer: Self-pay

## 2015-06-21 ENCOUNTER — Ambulatory Visit
Admission: RE | Admit: 2015-06-21 | Discharge: 2015-06-21 | Disposition: A | Discharge status: Home / Self Care | Payer: Medicaid Other | Source: Ambulatory Visit | Attending: Obstetrics and Gynecology | Admitting: Obstetrics and Gynecology

## 2015-06-21 ENCOUNTER — Other Ambulatory Visit: Payer: Medicaid Other

## 2015-06-21 VITALS — BP 129/64 | HR 90 | Temp 98.4°F | Resp 18 | Ht 62.0 in | Wt 183.0 lb

## 2015-06-21 DIAGNOSIS — O24912 Unspecified diabetes mellitus in pregnancy, second trimester: Secondary | ICD-10-CM

## 2015-06-21 NOTE — Progress Notes (Signed)
MFM follow up  Danielle Marks present for BS review.  In short, she is a 34yo G3P0011 at 7930w3d by 8w US performed at [facility].  She started her Lantus/Humalog regimen a week ago.  Simultaneously, she got sick and ended up in the ED and is only now feeling better.  She still needs to schedule an appointment at [facility] as well as get an EKG.  She did see an eye doctor, started her baby ASA and had her baseline labs drawn which were reviewed.  BP 129/64 mmHg  Pulse 90  Temp(Src) 98.4 F (36.9 C)  Resp 18  Ht 5\' 2"  (1.575 m)  Wt 183 lb (83.008 kg)  BMI 33.46 kg/m2  SpO2 97%  LMP 03/31/2015 (Approximate)  TSH 0.450 - 4.500 uIU/mL 0.737   T4, Total 4.5 - 12.0 ug/dL 7.8   T3 Uptake Ratio 24 - 39 % 23 (L)   Free Thyroxine Index 1.2 - 4.9  1.8        Sodium 135 - 145 mmol/L 136   Potassium 3.5 - 5.1 mmol/L 3.7   Chloride 101 - 111 mmol/L 105   CO2 22 - 32 mmol/L 23   Glucose, Bld 65 - 99 mg/dL 161130 (H)   BUN 6 - 20 mg/dL 9   Creatinine, Ser 0.960.44 - 1.00 mg/dL 0.450.48   Calcium 8.9 - 40.910.3 mg/dL 8.7 (L)   Total Protein 6.5 - 8.1 g/dL 7.0   Albumin 3.5 - 5.0 g/dL 4.1   AST 15 - 41 U/L 15   ALT 14 - 54 U/L 15   Alkaline Phosphatase 38 - 126 U/L 90   Total Bilirubin 0.3 - 1.2 mg/dL 0.6   GFR calc non Af Amer >60 mL/min >60   GFR calc Af Amer >60 mL/min >60        P/C ratio 80  Fasting BS:  72 to 128 2hr post breakfast:  106-140 2hr post lunch:  107-176 2hr post dinner:  101-293  A/P:  34yo G3P0011 at 3630w3d by 8w US with diabetes and elevated first trimester A1C (10) - first trimester US scheduled on Monday - schedule [facility] appt and EKG - encouraged to quit smoking - change Lantus to 45 and Humalog to 01/13/11 - f/up on Monday after her US for BS review - see prior consult note for additional details.

## 2015-06-24 ENCOUNTER — Ambulatory Visit: Payer: Self-pay

## 2015-06-28 ENCOUNTER — Ambulatory Visit
Admission: RE | Admit: 2015-06-28 | Discharge: 2015-06-28 | Disposition: A | Discharge status: Home / Self Care | Payer: Medicaid Other | Source: Ambulatory Visit | Attending: Obstetrics & Gynecology | Admitting: Obstetrics & Gynecology

## 2015-06-28 ENCOUNTER — Ambulatory Visit (HOSPITAL_BASED_OUTPATIENT_CLINIC_OR_DEPARTMENT_OTHER)
Admission: RE | Admit: 2015-06-28 | Discharge: 2015-06-28 | Disposition: A | Discharge status: Home / Self Care | Payer: Medicaid Other | Source: Ambulatory Visit | Attending: Obstetrics & Gynecology | Admitting: Obstetrics & Gynecology

## 2015-06-28 ENCOUNTER — Other Ambulatory Visit: Payer: Self-pay | Admitting: Obstetrics and Gynecology

## 2015-06-28 VITALS — BP 105/58 | HR 85 | Temp 97.8°F | Wt 187.0 lb

## 2015-06-28 DIAGNOSIS — E1010 Type 1 diabetes mellitus with ketoacidosis without coma: Secondary | ICD-10-CM

## 2015-06-28 DIAGNOSIS — E10311 Type 1 diabetes mellitus with unspecified diabetic retinopathy with macular edema: Secondary | ICD-10-CM

## 2015-06-28 DIAGNOSIS — E669 Obesity, unspecified: Secondary | ICD-10-CM

## 2015-06-28 DIAGNOSIS — Z720 Tobacco use: Secondary | ICD-10-CM

## 2015-06-28 DIAGNOSIS — Z98891 History of uterine scar from previous surgery: Secondary | ICD-10-CM | POA: Diagnosis not present

## 2015-06-28 DIAGNOSIS — Z3A12 12 weeks gestation of pregnancy: Secondary | ICD-10-CM | POA: Diagnosis not present

## 2015-06-28 DIAGNOSIS — O24911 Unspecified diabetes mellitus in pregnancy, first trimester: Secondary | ICD-10-CM

## 2015-06-28 DIAGNOSIS — O09521 Supervision of elderly multigravida, first trimester: Secondary | ICD-10-CM | POA: Diagnosis present

## 2015-06-28 DIAGNOSIS — Z36 Encounter for antenatal screening of mother: Secondary | ICD-10-CM | POA: Diagnosis present

## 2015-06-28 DIAGNOSIS — O24011 Pre-existing diabetes mellitus, type 1, in pregnancy, first trimester: Secondary | ICD-10-CM | POA: Diagnosis present

## 2015-06-28 NOTE — Progress Notes (Signed)
Maternal-Fetal Medicine Follow up Consultation Ms. Denyse AmassWillson returns for first trimester screen, genetic counseling and to review her blood sugar logs at 12 6/7 weeks.  See prior MFM consults dated 06/03/15 and 06/21/15.  At the start of her pregnancy, she was on a 70/30 insulin regimen which was switched to a Lantus/Humalog regimen.  She is currently taking Lantus 45 and Humalog 01/13/11.  She has no complaints and no hypoglycemic episodes.  Exam Bp 105/58, Pulse 85 Wt 187  Accuchecks (3/21 - 06/28/15) FS: 101, 102, 103, 90, 87, 93, 100 2hr PP Breakfast: 123, 112, 105, 91, 100 2hr PP Lunch: 120, 122, 120, 104 2hr PP Dinner: 117, 113  US (today):  Single live IUP at 12 6/7 weeks. Normal fluid. NT region appears normal  Assessment and Recommendations: Sugars are largely in control.  Will continue current regimen. Return in two weeks to review log. May need to increase Lantus then depending on how fastings go Detailed anat scan in 5 weeks scheduled Fetal echo in 10 weeks scheduled See MFM consult dated 06/03/15 for other pregnancy recommendations  Shelvie Salsberry, [place] A, MD

## 2015-06-28 NOTE — Progress Notes (Addendum)
Referring Provider:  Herold Harms Length of Consultation: 45 minutes  Ms. Kotlarz was referred to [facility] for genetic counseling because of advanced maternal age.  The patient will be 35 years old at the time of delivery.  This note summarizes the information we discussed.    We explained that the chance of a chromosome abnormality increases with maternal age.  Chromosomes and examples of chromosome problems were reviewed.  Humans typically have 46 chromosomes in each cell, with half passed through each sperm and egg.  Any change in the number or structure of chromosomes can increase the risk of problems in the physical and mental development of a pregnancy.   Based upon age of the patient, the chance of any chromosome abnormality was 1 in 68. The chance of Down syndrome, the most common chromosome problem associated with maternal age, was 1 in 86.  The risk of chromosome problems is in addition to the 3% general population risk for birth defects and mental retardation.  The greatest chance, of course, is that the baby would be born in good health.  We discussed the following prenatal screening and testing options for this pregnancy:  First trimester screening, which includes nuchal translucency ultrasound screen and first trimester maternal serum marker screening.  The nuchal translucency has approximately an 80% detection rate for Down syndrome and can be positive for other chromosome abnormalities as well as heart defects.  When combined with a maternal serum marker screening, the detection rate is up to 90% for Down syndrome and up to 97% for trisomy 18.     The chorionic villus sampling procedure is available for first trimester chromosome analysis.  This involves the withdrawal of a small amount of chorionic villi (tissue from the developing placenta).  Risk of pregnancy loss is estimated to be approximately 1 in 200 to 1 in 100 (0.5 to 1%).  There is approximately a  1% (1 in 100) chance that the CVS chromosome results will be unclear.  Chorionic villi cannot be tested for neural tube defects.     Maternal serum marker screening, a blood test that measures pregnancy proteins, can provide risk assessments for Down syndrome, trisomy 18, and open neural tube defects (spina bifida, anencephaly). Because it does not directly examine the fetus, it cannot positively diagnose or rule out these problems.  Targeted ultrasound uses high frequency sound waves to create an image of the developing fetus.  An ultrasound is often recommended as a routine means of evaluating the pregnancy.  It is also used to screen for fetal anatomy problems (for example, a heart defect) that might be suggestive of a chromosomal or other abnormality.   Amniocentesis involves the removal of a small amount of amniotic fluid from the sac surrounding the fetus with the use of a thin needle inserted through the maternal abdomen and uterus.  Ultrasound guidance is used throughout the procedure.  Fetal cells from amniotic fluid are directly evaluated and > 99.5% of chromosome problems and > 98% of open neural tube defects can be detected. This procedure is generally performed after the 15th week of pregnancy.  The main risks to this procedure include complications leading to miscarriage in less than 1 in 200 cases (0.5%).  We also reviewed the availability of cell free fetal DNA testing from maternal blood to determine whether or not the baby may have either Down syndrome, trisomy 28, or trisomy 53.  This test utilizes a maternal blood sample and DNA sequencing  technology to isolate circulating cell free fetal DNA from maternal plasma.  The fetal DNA can then be analyzed for DNA sequences that are derived from the three most common chromosomes involved in aneuploidy, chromosomes 13, 18, and 21.  If the overall amount of DNA is greater than the expected level for any of these chromosomes, aneuploidy is  suspected.  While we do not consider it a replacement for invasive testing and karyotype analysis, a negative result from this testing would be reassuring, though not a guarantee of a normal chromosome complement for the baby.  An abnormal result is certainly suggestive of an abnormal chromosome complement, though we would still recommend CVS or amniocentesis to confirm any findings from this testing.  Cystic Fibrosis screening was also discussed with the patient. Cystic fibrosis (CF) is one of the most common genetic conditions in persons of Caucasian ancestry.  This condition occurs in approximately 1 in 2,500 Caucasian persons and results in thickened secretions in the lungs, digestive, and reproductive systems.  For a baby to be at risk for having CF, both of the parents must be carriers for this condition.  Approximately 1 in 1525 Caucasian persons is a carrier for CF.  Current carrier testing looks for the most common mutations in the gene for CF and can detect approximately 90% of carriers in the Caucasian population.  This means that the carrier screening can greatly reduce, but cannot eliminate, the chance for an individual to have a child with CF.  If an individual is found to be a carrier for CF, then carrier testing would be available for the partner. As part of [place]'s newborn screening profile, all babies born in the state of [place] will have a two-tier screening process.  Specimens are first tested to determine the concentration of immunoreactive trypsinogen (IRT).  The top 5% of specimens with the highest IRT values then undergo DNA testing using a panel of over 40 common CF mutations.   We obtained a detailed family history and pregnancy history.  Ms. Andrey CampanileWilson reported that her full brother has 10 children.  Five of them are with one partner and all have cleft lip and/or cleft palate, as did their mother.  He has five children with another partner, all of whom are in good health.   We reviewed that most cases of cleft lip and/or palate are the result of multifactorial inheritance, but that there are some families in which clefting is a dominant trait.  This means that there is thought to be a single gene responsible for the condition and if a parent has that gene change and therefore has clefting, then each of their children has a 50% chance for also having the condition.  This history may be consistent with dominant inheritance; if so, then because it was inherited from their mother, then this pregnancy is not expected to be at risk. In addition, Ms. Andrey CampanileWilson reported a strong family history of mental health diagnoses.  This includes anxiety in herself and the father of the baby, bipolar disorder in her full brother and mother, suicide in her father, borderline personality disorder and anorexia in her maternal half sister and schizophrenia in the father of the baby's brother.  We discussed that the genetic factors involved in mental health conditions are not well understood, though based upon family histories, the genetic factors are known to be strong.  In some families, including this one, there may be dominant genetic changes that predispose to mental health conditions.  We do not have clinical genetic testing for mental health disorders at the current time, but we do encourage Ms. Sayre to be aware of this history as her children grow.  The remainder of the family history is unremarkable for birth defects, mental retardation, recurrent pregnancy loss or known chromosome abnormalities.  Ms. Altice stated that this is her third pregnancy, the first with her current partner.  Her first pregnancy resulted in early miscarriage.  The second resulted in the birth of a health daughter, now 50 years old.  She reported no complications or exposures that would be expected to increase the risk for birth defects other than diabetes.  See the MFM consultations regarding this history.  After  consideration of the options, Ms. Crownover elected to proceed with an ultrasound only and to decline CF and SMA carrier testing as well as aneuploidy screening and testing other than ultrasound.  She was scheduled to return in 5 weeks for an anatomy ultrasound and to go to [facility] in 10 weeks for a fetal echocardiogram.  An ultrasound was performed at the time of the visit.  The gestational age was consistent with  12 weeks.  Fetal anatomy could not be assessed due to early gestational age.  Please refer to the ultrasound report for details of that study.  Ms. Lakey was encouraged to call with questions or concerns.  We can be contacted at [phone].   Cherly Anderson, MS, CGC  I was present for this encounter  Osric Klopf, [place] A, MD

## 2015-06-29 ENCOUNTER — Ambulatory Visit: Payer: Medicaid Other | Admitting: Dietician

## 2015-06-29 ENCOUNTER — Encounter: Payer: Medicaid Other | Attending: Obstetrics and Gynecology | Admitting: Dietician

## 2015-06-29 ENCOUNTER — Encounter: Payer: Self-pay | Admitting: Dietician

## 2015-06-29 VITALS — BP 114/76 | Ht 62.0 in | Wt 186.7 lb

## 2015-06-29 DIAGNOSIS — E119 Type 2 diabetes mellitus without complications: Secondary | ICD-10-CM | POA: Insufficient documentation

## 2015-06-29 DIAGNOSIS — O24111 Pre-existing diabetes mellitus, type 2, in pregnancy, first trimester: Secondary | ICD-10-CM

## 2015-06-29 NOTE — Progress Notes (Addendum)
Diabetes Self-Management Education  Visit Type: First/Initial  Appt. Start Time:0930  Appt. End Time: 1030  06/29/2015  [name], identified by name and date of birth, is a 35 y.o. 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   ASSESSMENT  Blood pressure 114/76, height  (1.575 m), weight 186 lb 11.2 oz (84.687 kg), last menstrual period 03/31/2015. Body mass index is 34.14 kg/(m^2). c/o mild tingling in hands off and on at night EDC 01-07-16 (G3P1A1)-history of type 2 diabetes x10 years Per pt last A1C 10%  Pt reports decreased appetite at times   Diabetes Self-Management Education - 06/29/15 1104    Visit Information   Visit Type First/Initial   Initial Visit   Diabetes Type Type 2  complicated by pregnancy   Health Coping   How would you rate your overall health? Good   Psychosocial Assessment   Patient Belief/Attitude about Diabetes Motivated to manage diabetes   Self-care barriers None   Patient Concerns Weight Control   Special Needs None   Preferred Learning Style Visual;Auditory   Learning Readiness Contemplating   What is the last grade level you completed in school? 9   Complications   Last HgB A1C per patient/outside source 10 % (06-01-15)   How often do you check your blood sugar? --  4x/day   Fasting Blood glucose range (mg/dL)  16'X-096 per pt   Postprandial Blood glucose range (mg/dL) --  04'V-409 per pt   Number of hypoglycemic episodes per month 0   Have you had a dilated eye exam in the past 12 months? Yes   Have you had a dental exam in the past 12 months? No   Are you checking your feet? Yes   How many days per week are you checking your feet? 7   Dietary Intake   Breakfast --  meal times vary for all meals; eats breakfast 7a-9am   Snack (morning) --  none   Lunch --  eats lunch 12p-2p; eats fried foods, snack foods and desserts/sweets 2-3x/wk.   Snack (afternoon) --  none   Dinner --  eats supper  4p-7p   Snack (evening) --  eats yogurt or crackers at 8p-9p   Beverage(s) --  drinks 6-7 glasses of water /day and 2-3 sugar free drinks daily (uses saccharin )   Exercise   Exercise Type --  walks 10-20 min daily   How many days per week to you exercise? 7   Patient Education   Previous Diabetes Education Yes (in [place] and [place])   Disease state  --  discussed pathophysiology of type 2 diabetes and effect of pregnancy on BG's -discussed importance of tight BG control to lower risk of complications with pregnancy   Nutrition management  Role of diet in the treatment of diabetes and the relationship between the three main macronutrients and blood glucose level;Food label reading, portion sizes and measuring food.;Carbohydrate counting;Meal timing in regards to the patients' current diabetes medication.   Physical activity and exercise  Role of exercise on diabetes management, blood pressure control and cardiac health.  advised to carry candy or glucose tablets at all times -gave pt 1 tube of glucose tabs to carry if needed   Medications Taught/reviewed insulin injection, site rotation, insulin storage and needle disposal.;Reviewed patients medication for diabetes, action, purpose, timing of dose and side effects.   Monitoring Purpose and frequency of SMBG.;Identified appropriate SMBG and/or A1C goals.;Taught/discussed recording of test results and interpretation  of SMBG.   Acute complications Taught treatment of hypoglycemia - the 15 rule.  gave pt 1 tube of glucose tabs to carry if needed; gave pt medical alert ID card and coupon for free medical alert necklace   Chronic complications Relationship between chronic complications and blood glucose control;Dental care;Retinopathy and reason for yearly dilated eye exams   Psychosocial adjustment Role of stress on diabetes   Preconception care Pregnancy and GDM  Role of pre-pregnancy blood glucose control on the development of the  fetus;Reviewed with patient blood glucose goals with pregnancy;Role of family planning for patients with diabetes  discussed increased risk of complications with pregnancy due to diabetes and importance of tight BG control to lower risk   Personal strategies to promote health Lifestyle issues that need to be addressed for better diabetes care  smokes 4 cigs/day-encouraged to quit smoking      Individualized Plan for Diabetes Self-Management Training:   Learning Objective:  Patient will have a greater understanding of diabetes self-management. Patient education plan is to attend individual and/or group sessions per assessed needs and concerns.   Plan:   Patient Instructions  Read booklet on Diabetes Management for Mothers To Be booklet  Follow  Meal Planning Guidelines for Healthy Pregnancy Complete a 3 Day Food Record and bring to next appointment Check blood sugars 4 x day - before breakfast and 2 hrs after every meal and record  Bring blood sugar log to all appointments Continue walking 15-20 minutes daily as permitted by MD Avoid sweetened beverages and fruit juices-limit use of saccharin (can try Stevia, Splenda or Equal but limit to only 1-2 drinks/day) Limit intake of sweets, fried foods and snack foods Make healthy food choices Make dental appointment Carry candy or glucose tablets at all times Carry medical alert ID  Rotate sites for insulin injections Next appointment   07-16-15   Expected Outcomes:   positive  Education material provided: General Meal Planning Guidelines for Healthy Pregnancy, Diabetes Management for Mothers To Be booklet, 1 tube glucose tablets for PRN use, medical alert ID card and coupon  If problems or questions, patient to contact team via:  [phone]  Future DSME appointment:  07-16-15

## 2015-06-29 NOTE — Patient Instructions (Signed)
Read booklet on Diabetes Management for Mothers To Be booklet  Follow  Meal Planning Guidelines for Healthy Pregnancy Complete a 3 Day Food Record and bring to next appointment Check blood sugars 4 x day - before breakfast and 2 hrs after every meal and record  Bring blood sugar log to all appointments Continue walking 15-20 minutes daily as permitted by MD Avoid sweetened beverages and fruit juices-limit use of saccharin (can try Stevia, Splenda or Equal but limit to only 1-2 drinks/day) Limit intake of sweets, fried foods and snack foods Make healthy food choices Make dental appointment Carry candy or glucose tablets at all times Carry medical alert ID  Rotate sites for insulin injections Next appointment   07-16-15

## 2015-07-01 ENCOUNTER — Ambulatory Visit (INDEPENDENT_AMBULATORY_CARE_PROVIDER_SITE_OTHER): Payer: Medicaid Other | Admitting: Obstetrics and Gynecology

## 2015-07-01 ENCOUNTER — Encounter: Payer: Self-pay | Admitting: Obstetrics and Gynecology

## 2015-07-01 VITALS — BP 100/63 | HR 96 | Wt 187.7 lb

## 2015-07-01 DIAGNOSIS — Z789 Other specified health status: Secondary | ICD-10-CM

## 2015-07-01 DIAGNOSIS — E1010 Type 1 diabetes mellitus with ketoacidosis without coma: Secondary | ICD-10-CM

## 2015-07-01 DIAGNOSIS — Z2839 Other underimmunization status: Secondary | ICD-10-CM | POA: Insufficient documentation

## 2015-07-01 DIAGNOSIS — Z3401 Encounter for supervision of normal first pregnancy, first trimester: Secondary | ICD-10-CM | POA: Diagnosis not present

## 2015-07-01 DIAGNOSIS — Z98891 History of uterine scar from previous surgery: Secondary | ICD-10-CM

## 2015-07-01 DIAGNOSIS — O09521 Supervision of elderly multigravida, first trimester: Secondary | ICD-10-CM

## 2015-07-01 DIAGNOSIS — Z124 Encounter for screening for malignant neoplasm of cervix: Secondary | ICD-10-CM

## 2015-07-01 DIAGNOSIS — Z283 Underimmunization status: Secondary | ICD-10-CM

## 2015-07-01 DIAGNOSIS — O09899 Supervision of other high risk pregnancies, unspecified trimester: Secondary | ICD-10-CM

## 2015-07-01 DIAGNOSIS — O9989 Other specified diseases and conditions complicating pregnancy, childbirth and the puerperium: Secondary | ICD-10-CM

## 2015-07-01 DIAGNOSIS — E669 Obesity, unspecified: Secondary | ICD-10-CM

## 2015-07-01 DIAGNOSIS — Z720 Tobacco use: Secondary | ICD-10-CM

## 2015-07-01 LAB — POCT URINALYSIS DIPSTICK
BILIRUBIN UA: NEGATIVE
GLUCOSE UA: NEGATIVE
KETONES UA: NEGATIVE
Leukocytes, UA: NEGATIVE
Nitrite, UA: NEGATIVE
PH UA: 7.5
Protein, UA: NEGATIVE
Spec Grav, UA: 1.015
Urobilinogen, UA: NEGATIVE

## 2015-07-03 NOTE — Progress Notes (Signed)
OBSTETRIC INITIAL PRENATAL VISIT  Subjective:    Danielle Marks is being seen today for her first obstetrical visit.  This is a planned pregnancy. She is a G11P1011 female at [redacted]w[redacted]d gestation, Estimated Date of Delivery: 01/05/16 with Patient's last menstrual period was 03/31/2015 (consistent with 8 week sono). Her obstetrical history is significant for advanced maternal age, obesity, smoker and Type 1 Diabetes. Relationship with FOB: significant other, not living together. Patient does not intend to breast feed. Pregnancy history fully reviewed.    Obstetric History   G3   P1   T1   P0   A1   TAB0   SAB1   E0   M0   L1     # Outcome Date GA Lbr Len/2nd Weight Sex Delivery Anes PTL Lv  3 Current           2 Term 2011 [redacted]w[redacted]d  7 lb 8 oz (3.402 kg) F CS-LTranv EPI N Y     Complications: Non-reassuring fetal heart rate or rhythm affecting management of fetus  1 SAB 2010              Gynecologic History:  Last pap smear was ~ 3 years ago  Results were normal.  Denies h/o abnormal pap smears in the past.  Denieshistory of STIs.    Past Medical History  Diagnosis Date  . Diabetes mellitus without complication (HCC)   . Hypercholesteremia   . Anxiety      Family History  Problem Relation Age of Onset  . Diabetes Paternal Grandmother   . Cancer Neg Hx   . Heart disease Neg Hx      Past Surgical History  Procedure Laterality Date  . Dilation and curettage of uterus  2010  . Cesarean section  2011  . Wisdom tooth extraction  2012    Social History   Social History  . Marital Status: Widowed    Spouse Name: N/A  . Number of Children: N/A  . Years of Education: N/A   Occupational History  . unemployed    Social History Main Topics  . Smoking status: Current Every Day Smoker -- 0.25 packs/day    Types: Cigarettes  . Smokeless tobacco: Never Used  . Alcohol Use: No  . Drug Use: No  . Sexual Activity:    Partners: Male    Birth Control/ Protection: None   Other  Topics Concern  . Not on file   Social History Narrative     Current Outpatient Prescriptions on File Prior to Visit  Medication Sig Dispense Refill  . acetaminophen (TYLENOL) 325 MG tablet Take 325 mg by mouth every 6 (six) hours as needed.     Marland Kitchen aspirin 81 MG tablet Take 81 mg by mouth daily.    Marland Kitchen glucose blood (COOL BLOOD GLUCOSE TEST STRIPS) test strip Use as instructed 100 each 12  . insulin glargine (LANTUS) 100 UNIT/ML injection Inject 0.4 mLs (40 Units total) into the skin at bedtime. (Patient taking differently: Inject 45 Units into the skin at bedtime. ) 10 mL 11  . insulin lispro (HUMALOG) 100 UNIT/ML injection Inject 0.1 mLs (10 Units total) into the skin 3 (three) times daily before meals. (Patient taking differently: Inject 10 Units into the skin 3 (three) times daily before meals. Take 10 units before breakfast, 12 units before lunch and 14 units before supper) 10 mL 11  . prenatal vitamin w/FE, FA (PRENATAL 1 + 1) 27-1 MG TABS tablet Take 1 tablet  by mouth daily at 12 noon. Reported on 06/10/2015     No current facility-administered medications on file prior to visit.    Allergies  Allergen Reactions  . Ciprofloxacin Anaphylaxis     Review of Systems General:Not Present- Fever, Weight Loss and Weight Gain. Skin:Not Present- Rash. HEENT:Not Present- Blurred Vision, Headache and Bleeding Gums. Respiratory:Not Present- Difficulty Breathing. Breast:Not Present- Breast Mass. Cardiovascular:Not Present- Chest Pain, Elevated Blood Pressure, Fainting / Blacking Out and Shortness of Breath. Gastrointestinal:Not Present- Abdominal Pain, Constipation, Nausea and Vomiting. Female Genitourinary:Not Present- Frequency, Painful Urination, Pelvic Pain, Vaginal Bleeding, Vaginal Discharge, Contractions, regular, Fetal Movements Decreased, Urinary Complaints and Vaginal Fluid. Musculoskeletal:Not Present- Back Pain and Leg Cramps. Neurological:Not Present-  Dizziness. Psychiatric:Not Present- Depression.     Objective:   Blood pressure 100/63, pulse 96, weight 187 lb 11.2 oz (85.14 kg), last menstrual period 03/31/2015.  Body mass index is 34.32 kg/(m^2).  General Appearance:    Alert, cooperative, no distress, appears stated age, obese  Head:    Normocephalic, without obvious abnormality, atraumatic.   Eyes:    PERRL, conjunctiva/corneas clear, EOM's intact, both eyes  Ears:    Normal external ear canals, both ears  Nose:   Nares normal, septum midline, mucosa normal, no drainage or sinus tenderness  Throat:   Lips, mucosa, and tongue normal; teeth and gums normal  Neck:   Supple, symmetrical, trachea midline, no adenopathy; thyroid: no enlargement/tenderness/nodules; no carotid bruit or JVD  Back:     Symmetric, no curvature, ROM normal, no CVA tenderness  Lungs:     Clear to auscultation bilaterally, respirations unlabored  Chest Wall:    No tenderness or deformity   Heart:    Regular rate and rhythm, S1 and S2 normal, no murmur, rub or gallop  Breast Exam:    No tenderness, masses, or nipple abnormality  Abdomen:     Soft, non-tender, bowel sounds active all four quadrants, no masses, no organomegaly.  FHT 158 bpm.  Well healed Pfannenstiel incision.   Genitalia:    Pelvic:external genitalia normal, vagina without lesions, discharge, or tenderness, rectovaginal septum  normal. Cervix normal in appearance, no cervical motion tenderness, no adnexal masses or tenderness.  Pregnancy positive findings: uterine enlargement: 13-14 wk size, nontender.   Rectal:    Normal external sphincter.  No hemorrhoids appreciated. Internal exam not done.   Extremities:   Extremities normal, atraumatic, no cyanosis or edema  Pulses:   2+ and symmetric all extremities  Skin:   Skin color, texture, turgor normal, no rashes or lesions  Lymph nodes:   Cervical, supraclavicular, and axillary nodes normal  Neurologic:   CNII-XII intact, normal strength, sensation  and reflexes throughout      Assessment:   Pregnancy at 13 and 1/7 weeks    Patient Active Problem List   Diagnosis Date Noted  . Elderly multigravida with antepartum condition or complication 06/03/2015  . History of cesarean section 05/26/2015  . Obesity 05/26/2015  . Type 1 diabetes mellitus (HCC) 05/26/2015  . Tobacco user 05/26/2015     Plan:   Initial labs reviewed. Varicella and Rubella non-immune.  Will need vaccinations postpartum.  Pap smear performed today.  Prenatal vitamins encouraged. Problem list reviewed and updated. New OB counseling:  The patient has been given an overview regarding routine prenatal care.  Recommendations regarding diet, weight gain, and exercise in pregnancy were given.  Advised to gain no more than 20-25 lbs this pregnancy.  Prenatal testing, optional genetic testing, and ultrasound  use in pregnancy were reviewed.  AFP3 discussed: declined. Benefits of Breast Feeding were discussed. The patient is encouraged to consider nursing her baby post partum.  [facility] regarding management of diabetes in pregnancy.  Is scheduled for anatomy scan and fetal echo.  Continue to encourage smoking cessation (has cut down from 1 ppd to 1/4 ppd since discovery of pregnancy).  Patient with h/o prior C-section.  Discussed TOLAC vs repeat C-section, including risks/benefits of each.  Patient desires repeat C-section.  Follow up in 4 weeks.  50% of 30 min visit spent on counseling and coordination of care.     Hildred LaserAnika Laycie Schriner, MD [facility]

## 2015-07-05 ENCOUNTER — Telehealth: Payer: Self-pay | Admitting: Dietician

## 2015-07-05 LAB — PAP IG AND HPV HIGH-RISK
HPV, HIGH-RISK: POSITIVE — AB
PAP Smear Comment: 0

## 2015-07-05 NOTE — Telephone Encounter (Signed)
Called pt-no answer-left message for pt to call me with update on BG's  

## 2015-07-06 ENCOUNTER — Telehealth: Payer: Self-pay

## 2015-07-06 NOTE — Telephone Encounter (Signed)
Called pt LM for her informing her of the information below.

## 2015-07-06 NOTE — Telephone Encounter (Signed)
-----   Message from Hildred LaserAnika Cherry, MD sent at 07/05/2015  8:15 PM EDT ----- Please inform of normal pap but is HPB positive.  Recommend repeating pap smear in 1 year.

## 2015-07-12 ENCOUNTER — Encounter: Payer: Self-pay | Admitting: Dietician

## 2015-07-12 ENCOUNTER — Ambulatory Visit
Admission: RE | Admit: 2015-07-12 | Discharge: 2015-07-12 | Disposition: A | Discharge status: Home / Self Care | Payer: Medicaid Other | Source: Ambulatory Visit | Attending: Maternal & Fetal Medicine | Admitting: Maternal & Fetal Medicine

## 2015-07-12 ENCOUNTER — Telehealth: Payer: Self-pay

## 2015-07-12 VITALS — BP 112/62 | HR 90 | Temp 98.2°F | Resp 18 | Ht 62.0 in | Wt 187.0 lb

## 2015-07-12 DIAGNOSIS — Z720 Tobacco use: Secondary | ICD-10-CM | POA: Diagnosis not present

## 2015-07-12 DIAGNOSIS — F411 Generalized anxiety disorder: Secondary | ICD-10-CM | POA: Diagnosis not present

## 2015-07-12 DIAGNOSIS — E119 Type 2 diabetes mellitus without complications: Secondary | ICD-10-CM

## 2015-07-12 MED ORDER — CITALOPRAM HYDROBROMIDE 20 MG PO TABS: 20.0000 mg | ORAL_TABLET | Freq: Every day | ORAL | Status: DC

## 2015-07-12 NOTE — Progress Notes (Signed)
MFM Consult 35 yo G3 P1011 (previous c/s--desires repeat)  at 14 weeks with h/o Type 2 DM, BMI 34, history of anxiety/depression (stopped medications during pregnancy), +tobacco use (1/4 ppd)  here for blood glucose review. She reports feeling uninterested in pregnancy (but desires pregnancy/wants to feel connected to pregnancy), fatigue, overall uninterested in things. She's here with her (very active) daughter, Scarlet.   Current Outpatient Prescriptions on File Prior to Encounter  Medication Sig Dispense Refill  . acetaminophen (TYLENOL) 325 MG tablet Take 325 mg by mouth every 6 (six) hours as needed.     Marland Kitchen. aspirin 81 MG tablet Take 81 mg by mouth daily.    . insulin glargine (LANTUS) 100 UNIT/ML injection Inject 0.4 mLs (40 Units total) into the skin at bedtime. (Patient taking differently: Inject 45 Units into the skin at bedtime. ) 10 mL 11  . insulin lispro (HUMALOG) 100 UNIT/ML injection Inject 0.1 mLs (10 Units total) into the skin 3 (three) times daily before meals. (Patient taking differently: Inject 10 Units into the skin 3 (three) times daily before meals. Take 10 units before breakfast, 12 units before lunch and 14 units before supper) 10 mL 11  . prenatal vitamin w/FE, FA (PRENATAL 1 + 1) 27-1 MG TABS tablet Take 1 tablet by mouth daily at 12 noon. Reported on 06/10/2015    . glucose blood (COOL BLOOD GLUCOSE TEST STRIPS) test strip Use as instructed 100 each 12   No current facility-administered medications on file prior to encounter.   Allergies  Allergen Reactions  . Ciprofloxacin Anaphylaxis   Blood glucose values 3/29-4/10 Fasting: 88-134 (1 value of 162) most low 100 Post Breakfast: 106-138 (one value out of range) "      Lunch: 100-126 (one value 156) "      Dinner: 104, 111, 102, 123, 162  FHR 150s  Impression/Plan Poor fasting glycemic control--partly due to evening dietary indiscretion, one episode due to pt forgetting evening insulin dosing.  [facility] --Increase evening lantus to 47 u at HS and continue meal coverage 01/12/13 --she will return to your office in 2 weeks and will return to see us for MFM consult and detailed anatomic survey in 4 weeks --fetal echo scheduled for 22w (Bonnie)  2. Anxiety/Depression--She expressed dysphoria and lack of engagement with current pregnancy. No SI/HI.   Desires pregnancy, but wants to resume meds that she stopped. She reports taking Celexa in past. We discussed safety of this medication and other SSRIs (she was previously also taking Paxil, which has a higher association with potential teratogenicity/congenital heart defects) during pregnancy.  They are FDA category C medications. SSRIs have been associated with transient tachypnea --I ordered celexa 20mg  daily x 1 week, followed by 40mg  daily  3. Tobacco use: she expressed strong desire to stop smoking.  We referred [facility] the [facility] smoking cessation program.  She may benefit from nicotene gum.

## 2015-07-12 NOTE — Progress Notes (Signed)
Have not heard from pt-called -no answer-left message for pt to call me with update. Pt immediately returned call-reports  BG's are doing good-saw [facility] this am and MD increased Lantus to 47 units daily.  Reminded pt of FU appt 07-16-15 with dietitian

## 2015-07-16 ENCOUNTER — Ambulatory Visit: Payer: Medicaid Other | Admitting: Dietician

## 2015-07-28 ENCOUNTER — Ambulatory Visit: Payer: Medicaid Other | Admitting: Dietician

## 2015-07-29 ENCOUNTER — Ambulatory Visit (INDEPENDENT_AMBULATORY_CARE_PROVIDER_SITE_OTHER): Payer: Medicaid Other | Admitting: Obstetrics and Gynecology

## 2015-07-29 VITALS — BP 101/62 | HR 99 | Wt 188.9 lb

## 2015-07-29 DIAGNOSIS — O09522 Supervision of elderly multigravida, second trimester: Secondary | ICD-10-CM

## 2015-07-29 DIAGNOSIS — O24012 Pre-existing diabetes mellitus, type 1, in pregnancy, second trimester: Secondary | ICD-10-CM

## 2015-07-29 DIAGNOSIS — Z720 Tobacco use: Secondary | ICD-10-CM

## 2015-07-29 DIAGNOSIS — E669 Obesity, unspecified: Secondary | ICD-10-CM

## 2015-07-29 LAB — POCT URINALYSIS DIPSTICK
BILIRUBIN UA: NEGATIVE
Blood, UA: NEGATIVE
GLUCOSE UA: NEGATIVE
Ketones, UA: 5
Leukocytes, UA: NEGATIVE
NITRITE UA: NEGATIVE
Protein, UA: NEGATIVE
Spec Grav, UA: 1.01
UROBILINOGEN UA: NEGATIVE
pH, UA: 7

## 2015-07-29 MED ORDER — NICOTINE 7 MG/24HR TD PT24: 7.0000 mg | MEDICATED_PATCH | Freq: Every day | TRANSDERMAL | Status: DC

## 2015-07-29 MED ORDER — NICOTINE 14 MG/24HR TD PT24: 14.0000 mg | MEDICATED_PATCH | Freq: Every day | TRANSDERMAL | Status: DC

## 2015-07-29 NOTE — Progress Notes (Addendum)
ROB: Patient doing well.  [facility] by [facility] for glucose monitoring for h/o Type 1 Diabetes in pregnancy.  Notes that Lantus dose was increased.  Has anatomy scan with [facility] in 1 week, and fetal echo at 22-23 weeks.  Notes that smoking cessation was discussed, recommended use of nicotine gum and smoking cessation program with counseling at hospital. Patient notes that she does not desire to participate in the program, would like to try nicotine patch.  Discussed risks/benefits. Will start on Step 2 of patch (currently smokes 10 cig or less per day).  Patient has set quit date of 08/23/2015.  Discussed methods for managing cravings and substituting behaviors. Patient was initiated on Celexa due to depressive symptoms noted at [facility].  Currently taking 40 mg daily, which is helping. RTC in 4 weeks.

## 2015-08-02 ENCOUNTER — Ambulatory Visit
Admission: RE | Admit: 2015-08-02 | Discharge: 2015-08-02 | Disposition: A | Discharge status: Home / Self Care | Payer: Medicaid Other | Source: Ambulatory Visit | Attending: Obstetrics & Gynecology | Admitting: Obstetrics & Gynecology

## 2015-08-02 ENCOUNTER — Other Ambulatory Visit: Payer: Self-pay

## 2015-08-02 ENCOUNTER — Ambulatory Visit: Admission: RE | Admit: 2015-08-02 | Payer: Medicaid Other | Source: Ambulatory Visit

## 2015-08-02 ENCOUNTER — Ambulatory Visit (HOSPITAL_BASED_OUTPATIENT_CLINIC_OR_DEPARTMENT_OTHER)
Admission: RE | Admit: 2015-08-02 | Discharge: 2015-08-02 | Disposition: A | Discharge status: Home / Self Care | Payer: Medicaid Other | Source: Ambulatory Visit | Attending: Obstetrics & Gynecology | Admitting: Obstetrics & Gynecology

## 2015-08-02 DIAGNOSIS — Z7982 Long term (current) use of aspirin: Secondary | ICD-10-CM | POA: Insufficient documentation

## 2015-08-02 DIAGNOSIS — F17210 Nicotine dependence, cigarettes, uncomplicated: Secondary | ICD-10-CM | POA: Diagnosis not present

## 2015-08-02 DIAGNOSIS — O09522 Supervision of elderly multigravida, second trimester: Secondary | ICD-10-CM | POA: Diagnosis not present

## 2015-08-02 DIAGNOSIS — F419 Anxiety disorder, unspecified: Secondary | ICD-10-CM | POA: Insufficient documentation

## 2015-08-02 DIAGNOSIS — Z720 Tobacco use: Secondary | ICD-10-CM

## 2015-08-02 DIAGNOSIS — E1065 Type 1 diabetes mellitus with hyperglycemia: Secondary | ICD-10-CM | POA: Diagnosis not present

## 2015-08-02 DIAGNOSIS — Z36 Encounter for antenatal screening of mother: Secondary | ICD-10-CM | POA: Diagnosis not present

## 2015-08-02 DIAGNOSIS — E7800 Pure hypercholesterolemia, unspecified: Secondary | ICD-10-CM | POA: Diagnosis not present

## 2015-08-02 DIAGNOSIS — O99332 Smoking (tobacco) complicating pregnancy, second trimester: Secondary | ICD-10-CM | POA: Insufficient documentation

## 2015-08-02 DIAGNOSIS — Z6834 Body mass index (BMI) 34.0-34.9, adult: Secondary | ICD-10-CM | POA: Diagnosis not present

## 2015-08-02 DIAGNOSIS — E669 Obesity, unspecified: Secondary | ICD-10-CM | POA: Insufficient documentation

## 2015-08-02 DIAGNOSIS — O99212 Obesity complicating pregnancy, second trimester: Secondary | ICD-10-CM | POA: Insufficient documentation

## 2015-08-02 DIAGNOSIS — O24419 Gestational diabetes mellitus in pregnancy, unspecified control: Secondary | ICD-10-CM | POA: Insufficient documentation

## 2015-08-02 DIAGNOSIS — O09521 Supervision of elderly multigravida, first trimester: Secondary | ICD-10-CM

## 2015-08-02 DIAGNOSIS — O24911 Unspecified diabetes mellitus in pregnancy, first trimester: Secondary | ICD-10-CM

## 2015-08-02 DIAGNOSIS — Z3A17 17 weeks gestation of pregnancy: Secondary | ICD-10-CM | POA: Insufficient documentation

## 2015-08-02 DIAGNOSIS — O09892 Supervision of other high risk pregnancies, second trimester: Secondary | ICD-10-CM | POA: Insufficient documentation

## 2015-08-02 DIAGNOSIS — F329 Major depressive disorder, single episode, unspecified: Secondary | ICD-10-CM | POA: Insufficient documentation

## 2015-08-02 DIAGNOSIS — O24012 Pre-existing diabetes mellitus, type 1, in pregnancy, second trimester: Secondary | ICD-10-CM

## 2015-08-02 DIAGNOSIS — Z833 Family history of diabetes mellitus: Secondary | ICD-10-CM | POA: Insufficient documentation

## 2015-08-02 DIAGNOSIS — Z794 Long term (current) use of insulin: Secondary | ICD-10-CM | POA: Diagnosis not present

## 2015-08-02 DIAGNOSIS — O09512 Supervision of elderly primigravida, second trimester: Secondary | ICD-10-CM | POA: Diagnosis present

## 2015-08-02 MED ORDER — INSULIN GLARGINE 100 UNIT/ML ~~LOC~~ SOLN: 52.0000 [IU] | Freq: Every day | SUBCUTANEOUS | Status: AC

## 2015-08-02 MED ORDER — INSULIN LISPRO 100 UNIT/ML ~~LOC~~ SOLN: 14.0000 [IU] | Freq: Three times a day (TID) | SUBCUTANEOUS | Status: AC

## 2015-08-02 MED ORDER — CITALOPRAM HYDROBROMIDE 40 MG PO TABS: 40.0000 mg | ORAL_TABLET | Freq: Every day | ORAL | Status: AC

## 2015-08-02 NOTE — Discharge Instructions (Signed)
Please increase your insulin to Lantus 52 units, Humalog 10 units with breakfast, 14 units with lunch, 14 units with dinner

## 2015-08-02 NOTE — Progress Notes (Signed)
[facility] Consultation   Chief Complaint: Pre-gestational DM Type 1, Tobacco, anxiety/depression, BMI 34  HPI: [name] is a 35 y.o. G3P1011 at 6993w5d by LMP c/w 8 week sono who presents for follow up MFM consultation.  Today, [name] reports doing well.  Mood is much improved with Celexa.  No ob concerns.    Past Medical History: Patient  has a past medical history of Diabetes mellitus without complication (HCC); Hypercholesteremia; and Anxiety.  Past Surgical History: She  has past surgical history that includes Dilation and curettage of uterus (2010); Cesarean section (2011); and Wisdom tooth extraction (2012).  Obstetric History:  OB History    Gravida Para Term Preterm AB TAB SAB Ectopic Multiple Living   3 1 1  1  1   1     G1 - SAB G2 - Term, CD due to NRFHT  Gynecologic History:  Patient's last menstrual period was 03/31/2015 (approximate).   Medications:  Lantus 47, Humalog 01/12/13, Celexa 40 , ASA 81 mg, Nicotine patch   Allergies: Patient is allergic to ciprofloxacin.  Social History: Patient  reports that she has been smoking Cigarettes.  She has been smoking about 0.50 packs per day. She has never used smokeless tobacco. She reports that she does not drink alcohol or use illicit drugs.  Family History: family history includes Diabetes in her paternal grandmother. There is no history of Cancer or Heart disease.    Physical Exam: LMP 03/31/2015 (Approximate) FHR: seen on US  Asessement: 1. Type 1 diabetes mellitus with hyperglycemia (HCC)   2. Tobacco user   3. Elderly multigravida with antepartum condition or complication, first trimester     Plan: 1. PGDM type 1 - Intake A1C in February was 10.  On Lantus 47, Humalog 01/12/13.  [name] has done an excellent job keeping a log.  On review of her log, 16/19 fasting sugars are elevated in the 100-110's, 5/19 post breakfast, 2/19 post lunch, 5/19 post dinner.  Given the increased  sugars (especially fasting) we will increase her insulin by ~10% to Lantus 52, Humalog 01/14/13.  We plan to see [name] in 2 weeks to follow up her glucose control.  [name] had a detailed anatomy ultrasound today.  The fetal heart was suboptimally visualized due to fetal position.  We offered an attempt to follow up heart views in 2 weeks, [name] declined and prefers to wait for her fetal echo.  She is aware her echo will be beyond [redacted] weeks gestation.  The rest of the anatomy was within normal limits.  The placenta is low-lying.  We recommend follow up the placental location at a growth scan.     As above, [name] is scheduled for a fetal echocardiogram.  We also recommend q 4 week growth ultrasounds starting at 24-[redacted] weeks gestation.  We also recommend increased fetal monitoring at 32-34 weeks with either  twice weekly BPP or twice weekly NST and weekly AFI.  [name] is planning for a repeat CD.  Plan currently for delivery at 39 weeks.  If evidence of poor glycemic control would consider 37 weeks.    2. Anxiety/depression: Started on celexa last visit.  Today reports mood symptoms much improved.  Celexa refilled today.    3. Tobacco:  On patch and doing well with quit data 08/23/15.  4. BMI 34 - Weight gain reviewed and only 2lbs gained thus far!  Encouraged to continue minimal weight gain with max ~11-20 lbs.  5. Advanced maternal age - Previously had genetic counseling and declined aneuploidy screening/testing.   Total time spent with the patient was 30 minutes with greater than 50% spent in counseling and coordination of care. We appreciate this interesting consult and will be happy to be involved in the ongoing care of [name] in anyway her obstetricians desire.  Plan for follow up consultation and glucose monitoring in 2 weeks.   Kenton Kingfisher. Tyler Deis, MD Maternal-Fetal Medicine [facility]

## 2015-08-06 ENCOUNTER — Encounter: Payer: Self-pay | Admitting: Dietician

## 2015-08-06 NOTE — Progress Notes (Signed)
Patient has not returned messages left to reschedule appointment she missed on 07/28/15, which was the second missed appointment. Sent discharge letter to MD.

## 2015-08-09 ENCOUNTER — Ambulatory Visit: Payer: Medicaid Other

## 2015-08-16 ENCOUNTER — Ambulatory Visit: Payer: Medicaid Other

## 2015-08-19 ENCOUNTER — Ambulatory Visit: Payer: Medicaid Other

## 2015-08-31 ENCOUNTER — Ambulatory Visit (INDEPENDENT_AMBULATORY_CARE_PROVIDER_SITE_OTHER): Payer: Medicaid Other | Admitting: Obstetrics and Gynecology

## 2015-08-31 VITALS — BP 90/56 | HR 99 | Wt 194.9 lb

## 2015-08-31 DIAGNOSIS — Z98891 History of uterine scar from previous surgery: Secondary | ICD-10-CM

## 2015-08-31 DIAGNOSIS — Z720 Tobacco use: Secondary | ICD-10-CM

## 2015-08-31 DIAGNOSIS — O09522 Supervision of elderly multigravida, second trimester: Secondary | ICD-10-CM

## 2015-08-31 DIAGNOSIS — O24912 Unspecified diabetes mellitus in pregnancy, second trimester: Secondary | ICD-10-CM

## 2015-08-31 DIAGNOSIS — E669 Obesity, unspecified: Secondary | ICD-10-CM

## 2015-08-31 LAB — POCT URINALYSIS DIPSTICK
BILIRUBIN UA: NEGATIVE
Glucose, UA: NEGATIVE
Ketones, UA: NEGATIVE
LEUKOCYTES UA: NEGATIVE
NITRITE UA: NEGATIVE
Protein, UA: NEGATIVE
RBC UA: NEGATIVE
SPEC GRAV UA: 1.01
Urobilinogen, UA: NEGATIVE
pH, UA: 8.5

## 2015-08-31 MED ORDER — VITAMIN B-6 50 MG PO TABS: 50.0000 mg | ORAL_TABLET | Freq: Three times a day (TID) | ORAL | Status: DC | PRN

## 2015-08-31 MED ORDER — NICOTINE 14 MG/24HR TD PT24: 14.0000 mg | MEDICATED_PATCH | Freq: Every day | TRANSDERMAL | Status: DC

## 2015-08-31 MED ORDER — NICOTINE 7 MG/24HR TD PT24: 7.0000 mg | MEDICATED_PATCH | Freq: Every day | TRANSDERMAL | Status: DC

## 2015-08-31 NOTE — Progress Notes (Signed)
ROB: C/o nausea/vomiting over past several days.  Discussed use of Vitamin B6 and doxylamine.  Patient notes not being able to take Doxylamine due to extreme drowsiness.  Will prescribe Vitamin B6.  Patient has not initiated nicotine patches due to not being available at pharmacy (was e-scribed).  Re-prescribed and given physical copy of prescription. S/p normal anatomy scan.  To begin growth scans q 4 weeks beginning at 24 weeks, and antenatal testing with AFI checks beginning at 32 weeks.  Can be performed by [facility].  Has fetal echo scheduled in 2 weeks.  Notes blood sugars have been increasing over past few days (however does not have log book with her today). To be adjusted with [facility]. RTC in 4 weeks for routine OB visit.  Desires to discuss BTL at next visit.

## 2015-09-02 ENCOUNTER — Telehealth: Payer: Self-pay

## 2015-09-02 NOTE — Telephone Encounter (Signed)
Patient called and states she has bought a new glucose testing meter, AccuCheck Nano, and needs a prescription called in to her pharmacy for test strips. Patient also states that Dr. Valentino Saxonherry prescribed Vitamin B6 and unisom for nausea, however the patient is requesting a prescription that her insurance will cover.  After discussing with Dr. Dolphus JennySmall, orders received to call in prescription for AccuCheck Nano Test strips #120 with 5 refills to [facility] @ [address], [place], [place].  Dr. Dolphus JennySmall states the patient must call Dr. Valentino Saxonherry to discuss anti-nausea medication and additional prescription.  Patient notified of test strips being called in to her pharmacy and to also contact Dr. Valentino Saxonherry in regards to prescription for nausea medication.

## 2015-09-06 ENCOUNTER — Ambulatory Visit
Admission: RE | Admit: 2015-09-06 | Discharge: 2015-09-06 | Disposition: A | Discharge status: Home / Self Care | Payer: Medicaid Other | Source: Ambulatory Visit | Attending: Obstetrics and Gynecology | Admitting: Obstetrics and Gynecology

## 2015-09-06 VITALS — BP 111/67 | HR 103 | Temp 98.2°F | Wt 193.0 lb

## 2015-09-06 DIAGNOSIS — F329 Major depressive disorder, single episode, unspecified: Secondary | ICD-10-CM | POA: Insufficient documentation

## 2015-09-06 DIAGNOSIS — Z3A22 22 weeks gestation of pregnancy: Secondary | ICD-10-CM | POA: Diagnosis not present

## 2015-09-06 DIAGNOSIS — O24912 Unspecified diabetes mellitus in pregnancy, second trimester: Secondary | ICD-10-CM | POA: Diagnosis present

## 2015-09-06 DIAGNOSIS — O24913 Unspecified diabetes mellitus in pregnancy, third trimester: Secondary | ICD-10-CM

## 2015-09-06 DIAGNOSIS — O99332 Smoking (tobacco) complicating pregnancy, second trimester: Secondary | ICD-10-CM | POA: Diagnosis not present

## 2015-09-06 DIAGNOSIS — O09522 Supervision of elderly multigravida, second trimester: Secondary | ICD-10-CM | POA: Insufficient documentation

## 2015-09-06 DIAGNOSIS — O99342 Other mental disorders complicating pregnancy, second trimester: Secondary | ICD-10-CM | POA: Insufficient documentation

## 2015-09-06 LAB — HEMOGLOBIN A1C: Hgb A1c MFr Bld: 6.3 % — ABNORMAL HIGH (ref 4.0–6.0)

## 2015-09-06 NOTE — Progress Notes (Signed)
MFM Follow Up Consultation  [name] presents for f/up of BS review in the setting of preexisting diabetes (A1C at presentation was 10), tobacco use, elevated BMI and anxiety/depression.  She is a 34yo G3P1011 at 8768w5d by LMP c/w 8 week ultrasound.  She still is having some N/V which lasts all day.  Just taking B6 right now due to fatigue related to the doxylamine.  She has also started the nicotine patches and so far has been doing well with smoking cessation.  Mood is improved significantly on Celexa.  BP 111/67 mmHg  Pulse 103  Temp(Src) 98.2 F (36.8 C)  Wt 193 lb (87.544 kg)   BS review: FBS 96 to 164 - most after >120s 2hr post breakfast 100-123 - (only 1 >120) 2hr post lunch 100-133 (6/19 >120) 2hr post dinner 111-137 (3/19>120)  Fetal echocardiogram on 6/1 was reported to be normal.  A/P:  34yo G3P1 at 8868w5d with diabetes, elevated BMI tobacco use, depression 1.  Will increase Lantus to 56 and keep Humalog the same (01/14/13).  Will recheck A1C today.  F/up MFM consult in 2 weeks for BS review. 2.  Continue nicotine patch 3.  Continue celexa 4.  Continue vitamin B6 for nausea 5.  Will schedule follow up ultrasound for growth and placental location in about 5 weeks.

## 2015-09-20 ENCOUNTER — Telehealth: Payer: Self-pay

## 2015-09-20 ENCOUNTER — Ambulatory Visit
Admission: RE | Admit: 2015-09-20 | Discharge: 2015-09-20 | Disposition: A | Discharge status: Home / Self Care | Payer: Medicaid Other | Source: Ambulatory Visit | Attending: Maternal & Fetal Medicine | Admitting: Maternal & Fetal Medicine

## 2015-09-20 VITALS — BP 103/64 | HR 108 | Temp 97.9°F | Resp 17 | Ht 62.0 in | Wt 196.0 lb

## 2015-09-20 DIAGNOSIS — Z794 Long term (current) use of insulin: Secondary | ICD-10-CM

## 2015-09-20 DIAGNOSIS — E669 Obesity, unspecified: Secondary | ICD-10-CM | POA: Diagnosis not present

## 2015-09-20 DIAGNOSIS — E1310 Other specified diabetes mellitus with ketoacidosis without coma: Secondary | ICD-10-CM

## 2015-09-20 DIAGNOSIS — O09522 Supervision of elderly multigravida, second trimester: Secondary | ICD-10-CM

## 2015-09-20 DIAGNOSIS — Z98891 History of uterine scar from previous surgery: Secondary | ICD-10-CM | POA: Diagnosis not present

## 2015-09-20 DIAGNOSIS — Z3A24 24 weeks gestation of pregnancy: Secondary | ICD-10-CM

## 2015-09-20 DIAGNOSIS — O24912 Unspecified diabetes mellitus in pregnancy, second trimester: Secondary | ICD-10-CM

## 2015-09-20 DIAGNOSIS — Z720 Tobacco use: Secondary | ICD-10-CM

## 2015-09-20 DIAGNOSIS — E1110 Type 2 diabetes mellitus with ketoacidosis without coma: Secondary | ICD-10-CM

## 2015-09-20 NOTE — Telephone Encounter (Signed)
Left message on the pharmacy's voicemail to fill prescription for Lantus 34 units in the morning and 34 units at bedtime, prescribed by Dr. Lady DeutscherAndra James.

## 2015-09-20 NOTE — Progress Notes (Signed)
  MFM Follow Up Consultation   [name] is a 35 yo G3P1 at 255w5d who presents for f/up of BS review in the setting of preexisting diabetes (A1C at presentation was 10), previous cesarean, tobacco use, elevated BMI and anxiety/depression.   Although there are places in the chart that describe her diabetes as type 1, the patient states that she has type 2, which is consistent with here phenotype.  She was  diagnosed at age 35, after experience recurrent yeast infections.  At the time, her blood sugar was in the 600s.  She has never experienced significant hypoglycemia or diabetic ketoacidosis. She does not seek care between pregnancies because she does not have insurance.  She is a 34yo G3P1011 at 7555w5d by LMP c/w 8 week ultrasound. She still is having some N/V which lasts all day. She stopped taking B6 because it doesn't help, and "everything else makes me too sleepy."  She had no success quitting smoking, including with the help of the nicotine patch.  She continues to smoke 1/2 PPD.  She has been counseled about the maternal and fetal effects. Mood is stable on Celexa.   BP 103/64 mmHg  Pulse 108  Temp(Src) 97.9 F (36.6 C)  Resp 17  Ht 5\' 2"  (1.575 m)  Wt 196 lb (88.905 kg)  BMI 35.84 kg/m2  SpO2 97%  LMP 03/31/2015 (Approximate)  BS review:  FBS 103-221 - 15/15 > 95 2hr post breakfast 87-198 - (7/13 > 120)  2hr post lunch 100-180 (4/9 > 120)  2hr post dinner 119-164 (6/9 >  120)   Fetal echocardiogram on 6/1 was reported to be normal.   HbA1C on 09/06/2015 was 6.3 (down from 10.0 months ago)  A/P: 35 yo G3P1 at 7455w5d with type 2 diabetes, elevated BMI, previous cesarean,  tobacco use, depression   1. I recommended she increase her Lantus by 20% to 68 and divide the dose (AM and HS) to improve absorption. Thomes Lolling(Danielle Marks called the [facility] [address] I recommended she keep her Humalog the same (01/14/13). . F/up MFM consult in 10 days for BS review.  2.  Continue celexa  3. She has follow up ultrasound scheduled for growth and placental location on 10/11/2015. We will see her that day to review her blood sugars again.  I spent 30 minutes in consultation, more than half of which was spent counseling the patient and coordinating her care.

## 2015-09-28 ENCOUNTER — Ambulatory Visit (INDEPENDENT_AMBULATORY_CARE_PROVIDER_SITE_OTHER): Payer: Medicaid Other | Admitting: Obstetrics and Gynecology

## 2015-09-28 VITALS — BP 92/55 | HR 103 | Wt 199.1 lb

## 2015-09-28 DIAGNOSIS — O24912 Unspecified diabetes mellitus in pregnancy, second trimester: Secondary | ICD-10-CM

## 2015-09-28 DIAGNOSIS — E669 Obesity, unspecified: Secondary | ICD-10-CM

## 2015-09-28 DIAGNOSIS — Z720 Tobacco use: Secondary | ICD-10-CM

## 2015-09-28 DIAGNOSIS — O09522 Supervision of elderly multigravida, second trimester: Secondary | ICD-10-CM

## 2015-09-28 LAB — POCT URINALYSIS DIPSTICK
BILIRUBIN UA: NEGATIVE
Blood, UA: NEGATIVE
Glucose, UA: NEGATIVE
KETONES UA: NEGATIVE
NITRITE UA: NEGATIVE
PH UA: 6
Spec Grav, UA: >=1.03
Urobilinogen, UA: NEGATIVE

## 2015-09-28 NOTE — Progress Notes (Signed)
ROB: Patient notes nicotine patches caused rash, had to d/c.  Discussed other methods of smoking cessation, given handout with hotline number.  Has previously used Chantix. S/p normal fetal echo.  [facility] on Thursday.  Should begin growth scans q 4 weeks. Des[facility] since she has more appointments there.  Desires BTL. Other reversible forms of contraception were discussed with patient; she declines all other modalities. Risks of procedure discussed with patient including but not limited to: risk of regret, permanence of method, bleeding, infection, injury to surrounding organs and need for additional procedures.  Failure risk of 1-2 % with increased risk of ectopic gestation if pregnancy occurs was also discussed with patient.  Patient verbalized understanding of these risks and wants to proceed with sterilization.  Medicaid forms signed today. Blood consent signed. RTC in 4 weeks.

## 2015-09-28 NOTE — Patient Instructions (Signed)
Postpartum Tubal Ligation Postpartum tubal ligation (PPTL) is a procedure that closes the fallopian tubes right after childbirth or 1-2 days after childbirth. PPTL is done before the uterus returns to its normal location. The procedure is also called a mini-laparotomy. When the fallopian tubes are closed, the eggs that are released from the ovaries cannot enter the uterus, and sperm cannot reach the egg. PPTL is done so you will not be able to get pregnant or have a baby. Although this procedure may be undone (reversed), it should be considered permanent and irreversible. If you want to have future pregnancies, you should not have this procedure. LET YOUR HEALTH CARE PROVIDER KNOW ABOUT:  Any allergies you have.  All medicines you are taking, including vitamins, herbs, eye drops, creams, and over-the-counter medicines. This includes any use of steroids, either by mouth or in cream form.  Previous problems you or members of your family have had with the use of anesthetics.  Any blood disorders you have.  Previous surgeries you have had.  Any medical conditions you may have. RISKS AND COMPLICATIONS  Infection.  Bleeding.  Injury to surrounding organs.  Side effects from anesthetics.  Failure of the procedure.  Ectopic pregnancy.  Future regret about having the procedure done. BEFORE THE PROCEDURE  You may need to sign certain documents, including an informed consent form, up to 30 days before the date of your tubal ligation.  Follow instructions from your health care provider about eating and drinking restrictions. PROCEDURE  If done 1-2 days after a vaginal delivery:  You will be given one or more of the following:  A medicine that helps you relax (sedative).  A medicine that numbs the area (local anesthetic).  A medicine that makes you fall asleep (general anesthetic).  A medicine that is injected into an area of your body that numbs everything below the injection site  (regional anesthetic).  If you have been given general anesthetic, a tube will be put down your throat to help you breathe.  Your bladder may be emptied with a small tube (catheter).  A small cut (incision) will be made just above the pubic hair line.  The fallopian tubes will be located and brought up through the incision.  The fallopian tubes will be tied off or burned (cauterized), or they will be closed with a clamp, ring, or clip. In many cases, a small portion in the center of each fallopian tube will also be removed.  The incision will be closed with stitches (sutures).  A bandage (dressing) will be placed over the incision. The procedure may vary among health care providers and hospitals. If done after a cesarean delivery:  Tubal ligation will be done through the incision that was used for the cesarean delivery of your baby.  After the tubes are closed, the incision will be closed with stitches (sutures).  A bandage (dressing) will be placed over the incision. The procedure may vary among health care providers and hospitals. AFTER THE PROCEDURE  Your blood pressure, heart rate, breathing rate, and blood oxygen level will be monitored often until the medicines you were given have worn off.  You will be given pain medicine as needed.  If you had general anesthetic, you may have some mild discomfort in your throat. This is from the breathing tube that was placed in your throat while you were sleeping.  You may feel tired, and you should rest for the remainder of the day.  You may have some pain   or cramps in the abdominal area for 3-7 days.   This information is not intended to replace advice given to you by your health care provider. Make sure you discuss any questions you have with your health care provider.   Document Released: 03/20/2005 Document Revised: 08/04/2014 Document Reviewed: 07/01/2011 Elsevier Interactive Patient Education 2016 [employer] Inc[employer]  

## 2015-09-30 ENCOUNTER — Ambulatory Visit
Admission: RE | Admit: 2015-09-30 | Discharge: 2015-09-30 | Disposition: A | Discharge status: Home / Self Care | Payer: Medicaid Other | Source: Ambulatory Visit | Attending: Obstetrics and Gynecology | Admitting: Obstetrics and Gynecology

## 2015-09-30 ENCOUNTER — Telehealth: Payer: Self-pay

## 2015-09-30 VITALS — BP 112/55 | HR 105 | Temp 98.0°F | Resp 18

## 2015-09-30 DIAGNOSIS — O24112 Pre-existing diabetes mellitus, type 2, in pregnancy, second trimester: Secondary | ICD-10-CM | POA: Insufficient documentation

## 2015-09-30 NOTE — Telephone Encounter (Signed)
Patient called the clinic and states that [facility] on [address] is stating that they haven't received her prescription.  The patient requests that we change her pharmacy to [facility] on [address] in [place], [place].  Refill prescriptions for celexa and nano glucometer test strips have been called in to [facility] on [address] as ordered by Dr. Leatha GildingLivingston and the patient's request.  RN spoke with pharmacy associate who transcribed the prescriptions.

## 2015-09-30 NOTE — Progress Notes (Signed)
[facility] Consultation   Chief Complaint: blood sugar review  HPI: [name] is a 35 y.o. G3P1011 at 657w1d   for consult due to type 2 DM on insulin  Past Medical History: Patient  has a past medical history of Diabetes mellitus without complication (HCC); Hypercholesteremia; and Anxiety.  Past Surgical History: She  has past surgical history that includes Dilation and curettage of uterus (2010); Cesarean section (2011); and Wisdom tooth extraction (2012).  Obstetric History:  OB History    Gravida Para Term Preterm AB TAB SAB Ectopic Multiple Living   3 1 1  1  1   1       Medications  Current outpatient prescriptions:  .  acetaminophen (TYLENOL) 325 MG tablet, Take 325 mg by mouth every 6 (six) hours as needed. , Disp: , Rfl:  .  aspirin (GOODSENSE ASPIRIN) 81 MG chewable tablet, Chew by mouth., Disp: , Rfl:  .  citalopram (CELEXA) 40 MG tablet, Take 1 tablet (40 mg total) by mouth daily., Disp: 30 tablet, Rfl: 1 .  glucose blood (COOL BLOOD GLUCOSE TEST STRIPS) test strip, Use as instructed, Disp: 100 each, Rfl: 12 .  insulin glargine (LANTUS) 100 UNIT/ML injection, Inject 0.52 mLs (52 Units total) into the skin at bedtime. (Patient taking differently: Inject 34 Units into the skin 2 (two) times daily. ), Disp: 10 mL, Rfl: 11 .  insulin lispro (HUMALOG) 100 UNIT/ML injection, Inject 0.14 mLs (14 Units total) into the skin 3 (three) times daily before meals. Please take 10 units with breakfast, 14 units with lunch and 14 units with dinner (Patient taking differently: Inject 14 Units into the skin 3 (three) times daily before meals. Please take 12 units with breakfast, 14 units with lunch and 14 units with dinner), Disp: 10 mL, Rfl: 11 .  prenatal vitamin w/FE, FA (PRENATAL 1 + 1) 27-1 MG TABS tablet, Take 1 tablet by mouth daily at 12 noon. Reported on 06/10/2015, Disp: , Rfl:  .  Multiple Vitamins-Minerals (MULTIVITAMIN ADULT PO), Take by mouth. Reported on  09/30/2015, Disp: , Rfl:  Allergies: Patient is allergic to ciprofloxacin.  Social History: Patient  reports that she has been smoking Cigarettes.  She has been smoking about 0.50 packs per day. She has never used smokeless tobacco. She reports that she does not drink alcohol or use illicit drugs.  Family History: family history includes Diabetes in her paternal grandmother. There is no history of Cancer or Heart disease.  Review of Systems A full 12 point review of systems was negative or as noted in the History of Present Illness.  Physical Exam: BP 112/55 mmHg  Pulse 105  Temp(Src) 98 F (36.7 C) (Oral)  Resp 18  SpO2 98%  LMP 03/31/2015 (Approximate)  FBS 93-125 (8/10 >100) 2hpp breakfast 102=155 (2/10 >120) 2h pp lunch 101-146 (3/9 >120) 2h pp supper 102-130 (3/8 >120)  Asessement: T2 DM in pregnancy on divided lantus and tid humalog with greatly improved control - mildly elevated FBS  Depression on celexa  Obesity   Plan: Refill Celexa 40 mg qd Refill glucometer strips for nano  I requested pt check occ 3 am blood sugar - if high will add additional lantus at night to bring down fasting - if low will hold at current dose mildly elevated FBS may be "[name] " due to am cortisol surge  growth scan and BS review 10/11/15  hgb A1c then    Total time spent with the patient was  30 minutes with greater than 50% spent in counseling and coordination of care. We appreciate this interesting consult and will be happy to be involved in the ongoing care of Ms. Gick in anyway her obstetricians desire.  Jimmey RalphLivingston, Tayler Heiden, MD Maternal-Fetal Medicine [facility]

## 2015-10-11 ENCOUNTER — Ambulatory Visit
Admission: RE | Admit: 2015-10-11 | Discharge: 2015-10-11 | Disposition: A | Discharge status: Home / Self Care | Payer: Medicaid Other | Source: Ambulatory Visit | Attending: Maternal and Fetal Medicine | Admitting: Maternal and Fetal Medicine

## 2015-10-11 ENCOUNTER — Other Ambulatory Visit: Payer: Self-pay | Admitting: Obstetrics and Gynecology

## 2015-10-11 ENCOUNTER — Ambulatory Visit (HOSPITAL_BASED_OUTPATIENT_CLINIC_OR_DEPARTMENT_OTHER)
Admission: RE | Admit: 2015-10-11 | Discharge: 2015-10-11 | Disposition: A | Discharge status: Home / Self Care | Payer: Medicaid Other | Source: Ambulatory Visit | Attending: Maternal and Fetal Medicine | Admitting: Maternal and Fetal Medicine

## 2015-10-11 DIAGNOSIS — Z3A27 27 weeks gestation of pregnancy: Secondary | ICD-10-CM | POA: Insufficient documentation

## 2015-10-11 DIAGNOSIS — O24913 Unspecified diabetes mellitus in pregnancy, third trimester: Secondary | ICD-10-CM | POA: Diagnosis not present

## 2015-10-11 DIAGNOSIS — Z794 Long term (current) use of insulin: Secondary | ICD-10-CM

## 2015-10-11 DIAGNOSIS — Z98891 History of uterine scar from previous surgery: Secondary | ICD-10-CM | POA: Diagnosis not present

## 2015-10-11 DIAGNOSIS — E1310 Other specified diabetes mellitus with ketoacidosis without coma: Secondary | ICD-10-CM | POA: Diagnosis not present

## 2015-10-11 DIAGNOSIS — Z36 Encounter for antenatal screening of mother: Secondary | ICD-10-CM | POA: Diagnosis not present

## 2015-10-11 DIAGNOSIS — O4403 Placenta previa specified as without hemorrhage, third trimester: Secondary | ICD-10-CM | POA: Insufficient documentation

## 2015-10-11 DIAGNOSIS — O24912 Unspecified diabetes mellitus in pregnancy, second trimester: Secondary | ICD-10-CM | POA: Diagnosis not present

## 2015-10-11 DIAGNOSIS — E1110 Type 2 diabetes mellitus with ketoacidosis without coma: Secondary | ICD-10-CM

## 2015-10-11 NOTE — Progress Notes (Signed)
Dear Dr Valentino Saxonherry,  Thank you for referring Danielle Marks for ongoing consultation regarding her type 2 diabetes mellitus in pregnancy (diagnosed with diabetes at age 35, no history of DKA).  We spent 30 minutes with Danielle Marks of which more than 50% was counseling and coordinating care. We discussed the effects of her diabetes on the pregnancy and the fetus as well as the pregnancy effects on her diabetes. We reviewed the increased risk of congenital anomalies, fetal growth disturbances, fetal death, and neonatal metabolic complications and the importance of good glucose control to minimize these risks. We discussed increasing insulin requirements with advancing pregnancy and the need for frequent adjustment of insulin dosages to meet these needs. We cautioned her about over control and the need to avoid hypoglycemia.    The goals of her insulin regimen is to have: 1. Fasting a capillary blood glucose between 60 and 95 mg/dl; 2. Two hour postprandial capillary blood glucoses less than 120 mg/dl.  Current values: 15/16 FBS abnormal (93) 98-220 3/11 PB abnormal 5/11 PL abnormal 4/9 PD abnormal  Danielle Marks was resistant to suggestions of normal range fasting blood sugars and reported that her goal is 100-120. We discussed the fact that 15/16 of her fasting blood sugars are elevated and that increases the risk of a neonate requiring ICU care as well as stillbirth. I increased her bedtime lantus by 20% to 40 units.  Dr Leatha GildingLivingston had requested checking a few 2-3am values to ensure these weren't [name] but the patient didn't do that because she has been sleeping well.  Her current recommended regimen is  34u Lantus in the am Mealtime short acting 12u/14u/16u 40u Lantus at bedtime.    We will be happy to be involved in the ongoing care of Danielle Marks in anyway you desire.  She will follow-up here in 2 weeks and have a repeat growth ultrasound in 4 weeks (see separate ultrasound  report)  Danielle PhenesBrenna L Niema Carrara, MD

## 2015-10-14 ENCOUNTER — Telehealth: Payer: Self-pay

## 2015-10-14 NOTE — Telephone Encounter (Signed)
Patient called requesting refill for Lanuts.  Lantus 34 units in the morning and Lantus 40 units at bedtime 1 month supply with 4 refills [facility] [place], [place] with permission to substitute with lantus pen per Dr. Catha GosselinSmall's order. Order read back and confirmed.

## 2015-10-25 ENCOUNTER — Ambulatory Visit
Admission: RE | Admit: 2015-10-25 | Discharge: 2015-10-25 | Disposition: A | Discharge status: Home / Self Care | Payer: Medicaid Other | Source: Ambulatory Visit | Attending: Obstetrics and Gynecology | Admitting: Obstetrics and Gynecology

## 2015-10-25 DIAGNOSIS — O24913 Unspecified diabetes mellitus in pregnancy, third trimester: Secondary | ICD-10-CM

## 2015-10-25 NOTE — Progress Notes (Signed)
MFM Follow Up  Chief Complaint:  Blood sugar review  HPI:  Danielle Marks is a 35yo G3P1011 at [redacted]w[redacted]d here for f/up of BS in the setting of type 2 diabetes on insulin  She reports feeling over all well although she has noticed an increase in her BS despite not changing her diet or activity.  Current insulin regimen: 34 Lantus in the am 40 Lantus in the pm Humalog 03/17/15  Fasting blood sugars:  105-186 (all abnormal) 2hr post breakfast:  103-222 (all abnormal save 1) 2hr post lunch:  140-229 (all abnormal) 2hr post dinner:  138-200 (all abnormal)  BP (!) 103/54   Pulse 95   Temp 98.1 F (36.7 C)   Wt 201 lb (91.2 kg)   LMP 03/31/2015 (Approximate)   BMI 36.76 kg/m   Last EFW on 7/10 was 43rd percentile Last A1C 6.3 (6/5) down from 10 at the start of the pregnancy (2/28)  A/P:  35yo G3P1011 at [redacted]w[redacted]d here for f/up of BS in the setting of type 2 diabetes on insulin 1.  Increase insulin to the following: 38 Lantus in the am 44 Lantus in the pm Humalog 14/16/18 2.  Return in 1 week for BS review with likely further increases 3.  RTC 2 weeks for repeat growth [place] 4.  Start antenatal testing at 32 weeks (2x weekly if BS remain abnormal) 5.  Consider delivery around 37 weeks

## 2015-10-28 ENCOUNTER — Ambulatory Visit (INDEPENDENT_AMBULATORY_CARE_PROVIDER_SITE_OTHER): Payer: Medicaid Other | Admitting: Obstetrics and Gynecology

## 2015-10-28 VITALS — BP 98/63 | HR 98 | Wt 200.1 lb

## 2015-10-28 DIAGNOSIS — Z98891 History of uterine scar from previous surgery: Secondary | ICD-10-CM

## 2015-10-28 DIAGNOSIS — O24112 Pre-existing diabetes mellitus, type 2, in pregnancy, second trimester: Secondary | ICD-10-CM

## 2015-10-28 DIAGNOSIS — E669 Obesity, unspecified: Secondary | ICD-10-CM

## 2015-10-28 DIAGNOSIS — Z720 Tobacco use: Secondary | ICD-10-CM

## 2015-10-28 DIAGNOSIS — O09523 Supervision of elderly multigravida, third trimester: Secondary | ICD-10-CM

## 2015-10-28 LAB — POCT URINALYSIS DIPSTICK
BILIRUBIN UA: NEGATIVE
Blood, UA: NEGATIVE
Glucose, UA: NEGATIVE
Ketones, UA: 40
LEUKOCYTES UA: NEGATIVE
Nitrite, UA: NEGATIVE
PH UA: 7.5
Spec Grav, UA: 1.015
UROBILINOGEN UA: NEGATIVE

## 2015-10-30 NOTE — Progress Notes (Signed)
ROB: Patient denies complaints. Is s/p [facility] consult earlier this week, insulin regimen now 34u Lantus in the am, Mealtime short acting 12u/14u/16u, 40u Lantus at bedtime.  Notes blood sugars are still elevated, has another apptin 1-2 weeks. Mentions that [facility] may recommend delivery closer to 37 weeks. Will await recommendaiton.  Growth scan in 2 weeks.  To begin antenatal testing at 2 weeks.  Can perform antenatal testing at [facility].  Continued to encourage smoking cessation.

## 2015-11-01 ENCOUNTER — Institutional Professional Consult (permissible substitution): Payer: Medicaid Other

## 2015-11-08 ENCOUNTER — Ambulatory Visit
Admission: RE | Admit: 2015-11-08 | Discharge: 2015-11-08 | Disposition: A | Discharge status: Home / Self Care | Payer: Medicaid Other | Source: Ambulatory Visit | Attending: Maternal and Fetal Medicine | Admitting: Maternal and Fetal Medicine

## 2015-11-08 ENCOUNTER — Ambulatory Visit (HOSPITAL_BASED_OUTPATIENT_CLINIC_OR_DEPARTMENT_OTHER)
Admission: RE | Admit: 2015-11-08 | Discharge: 2015-11-08 | Disposition: A | Discharge status: Home / Self Care | Payer: Medicaid Other | Source: Ambulatory Visit | Attending: Maternal and Fetal Medicine | Admitting: Maternal and Fetal Medicine

## 2015-11-08 ENCOUNTER — Other Ambulatory Visit: Payer: Self-pay | Admitting: Obstetrics and Gynecology

## 2015-11-08 ENCOUNTER — Ambulatory Visit (INDEPENDENT_AMBULATORY_CARE_PROVIDER_SITE_OTHER): Payer: Medicaid Other | Admitting: Obstetrics and Gynecology

## 2015-11-08 VITALS — BP 121/64 | HR 94 | Temp 98.1°F | Resp 18 | Ht 62.0 in | Wt 201.0 lb

## 2015-11-08 VITALS — BP 117/66 | HR 100 | Wt 201.8 lb

## 2015-11-08 DIAGNOSIS — O24913 Unspecified diabetes mellitus in pregnancy, third trimester: Secondary | ICD-10-CM

## 2015-11-08 DIAGNOSIS — Z3A31 31 weeks gestation of pregnancy: Secondary | ICD-10-CM | POA: Insufficient documentation

## 2015-11-08 DIAGNOSIS — O24112 Pre-existing diabetes mellitus, type 2, in pregnancy, second trimester: Secondary | ICD-10-CM | POA: Diagnosis not present

## 2015-11-08 DIAGNOSIS — IMO0001 Reserved for inherently not codable concepts without codable children: Secondary | ICD-10-CM

## 2015-11-08 DIAGNOSIS — Z1389 Encounter for screening for other disorder: Secondary | ICD-10-CM

## 2015-11-08 DIAGNOSIS — Z794 Long term (current) use of insulin: Secondary | ICD-10-CM | POA: Diagnosis not present

## 2015-11-08 DIAGNOSIS — Z36 Encounter for antenatal screening of mother: Secondary | ICD-10-CM

## 2015-11-08 DIAGNOSIS — O24113 Pre-existing diabetes mellitus, type 2, in pregnancy, third trimester: Secondary | ICD-10-CM | POA: Diagnosis not present

## 2015-11-08 DIAGNOSIS — E1110 Type 2 diabetes mellitus with ketoacidosis without coma: Secondary | ICD-10-CM

## 2015-11-08 DIAGNOSIS — E1310 Other specified diabetes mellitus with ketoacidosis without coma: Secondary | ICD-10-CM

## 2015-11-08 DIAGNOSIS — E1165 Type 2 diabetes mellitus with hyperglycemia: Secondary | ICD-10-CM

## 2015-11-08 DIAGNOSIS — O09523 Supervision of elderly multigravida, third trimester: Secondary | ICD-10-CM

## 2015-11-08 DIAGNOSIS — Z369 Encounter for antenatal screening, unspecified: Secondary | ICD-10-CM

## 2015-11-08 LAB — POCT URINALYSIS DIPSTICK
BILIRUBIN UA: NEGATIVE
Blood, UA: NEGATIVE
Glucose, UA: NEGATIVE
KETONES UA: NEGATIVE
LEUKOCYTES UA: NEGATIVE
Nitrite, UA: NEGATIVE
PROTEIN UA: NEGATIVE
SPEC GRAV UA: 1.015
Urobilinogen, UA: NEGATIVE
pH, UA: 7

## 2015-11-08 NOTE — Progress Notes (Signed)
NONSTRESS TEST INTERPRETATION  INDICATIONS: Pre-existing type 2 Diabetes, and uncontrolled with ketoacidosis  FHR baseline: 140 RESULTS: reactive COMMENTS: Pt states blood sugars are not stable. Noted a couple of small contractions on NST and pt states she did feel them a little.   PLAN: 1. Continue fetal kick counts twice a day. 2. Continue antepartum testing as scheduled-Biweekly   Fenton Mallingebbie Raidyn Breiner, LPN

## 2015-11-08 NOTE — Progress Notes (Addendum)
MFM Follow Up  Chief Complaint:  Blood sugar review  HPI:  Ms. Andrey CampanileWilson is a 35yo G3P1011 at 4467w5d here for f/up of BS in the setting of type 2 diabetes on insulin  She reports feeling over all well although her blood sugars remain elevated and she is urinating frequently.  Current insulin regimen: 38 Lantus in the am 44 Lantus in the pm Humalog 03/17/15  Fasting blood sugars:  79-228 (all abnormal except 1l) 2hr post breakfast:  82-158 (all abnormal save 5) 2hr post lunch:  75-202 (all abnormal except 1) 2hr post dinner:  162-210 (all abnormal)  US today was 46th percentile but images were limited by fetal position and maternal body habitus. Last A1C 6.3 (6/5) down from 10 at the start of the pregnancy (2/28)  A/P:  35yo G3P1011 at 5467w5d here for f/up of BS in the setting of type 2 diabetes on insulin 1.  Increase insulin to the following: 44 Lantus in the am 50 Lantus in the pm Humalog 14/16/18 2.  Return in 1 week for BS review with likely further increases 3.  US today-repeat in 4 weeks prn 4.  Start antenatal testing at 32 weeks (2x weekly) 5.  Consider delivery around 37 weeks  I personally performed the service with 30 minutes and more than half in counseling and coordination of care.  Camelia PhenesBrenna L Hughes, MD

## 2015-11-11 ENCOUNTER — Ambulatory Visit (INDEPENDENT_AMBULATORY_CARE_PROVIDER_SITE_OTHER): Payer: Medicaid Other | Admitting: Obstetrics and Gynecology

## 2015-11-11 VITALS — BP 122/66 | HR 94 | Wt 202.2 lb

## 2015-11-11 DIAGNOSIS — Z1389 Encounter for screening for other disorder: Secondary | ICD-10-CM

## 2015-11-11 DIAGNOSIS — E1165 Type 2 diabetes mellitus with hyperglycemia: Secondary | ICD-10-CM

## 2015-11-11 DIAGNOSIS — Z36 Encounter for antenatal screening of mother: Secondary | ICD-10-CM

## 2015-11-11 DIAGNOSIS — IMO0001 Reserved for inherently not codable concepts without codable children: Secondary | ICD-10-CM

## 2015-11-11 DIAGNOSIS — O09523 Supervision of elderly multigravida, third trimester: Secondary | ICD-10-CM

## 2015-11-11 DIAGNOSIS — Z369 Encounter for antenatal screening, unspecified: Secondary | ICD-10-CM

## 2015-11-11 LAB — POCT URINALYSIS DIPSTICK
BILIRUBIN UA: NEGATIVE
Blood, UA: NEGATIVE
GLUCOSE UA: NEGATIVE
KETONES UA: NEGATIVE
LEUKOCYTES UA: NEGATIVE
NITRITE UA: NEGATIVE
Spec Grav, UA: 1.01
Urobilinogen, UA: NEGATIVE
pH, UA: 7

## 2015-11-11 NOTE — Progress Notes (Signed)
NONSTRESS TEST INTERPRETATION  INDICATIONS:  Pre-existing type 2 Diabetes, and uncontrolled with ketoacidosis  FHR baseline: 130-140 RESULTS: reactive COMMENTS:    PLAN: 1. Continue fetal kick counts twice a day. 2. Continue antepartum testing as scheduled-Biweekly   Fenton Mallingebbie Sherley Leser, LPN

## 2015-11-15 ENCOUNTER — Ambulatory Visit
Admission: RE | Admit: 2015-11-15 | Discharge: 2015-11-15 | Disposition: A | Discharge status: Home / Self Care | Payer: Medicaid Other | Source: Ambulatory Visit | Attending: Maternal & Fetal Medicine | Admitting: Maternal & Fetal Medicine

## 2015-11-15 ENCOUNTER — Ambulatory Visit (INDEPENDENT_AMBULATORY_CARE_PROVIDER_SITE_OTHER): Payer: Medicaid Other | Admitting: Obstetrics and Gynecology

## 2015-11-15 VITALS — BP 117/69 | HR 95 | Wt 202.6 lb

## 2015-11-15 VITALS — BP 113/60 | HR 104 | Temp 98.7°F | Wt 203.0 lb

## 2015-11-15 DIAGNOSIS — E1165 Type 2 diabetes mellitus with hyperglycemia: Principal | ICD-10-CM

## 2015-11-15 DIAGNOSIS — O09523 Supervision of elderly multigravida, third trimester: Secondary | ICD-10-CM

## 2015-11-15 DIAGNOSIS — Z36 Encounter for antenatal screening of mother: Secondary | ICD-10-CM

## 2015-11-15 DIAGNOSIS — Z1389 Encounter for screening for other disorder: Secondary | ICD-10-CM

## 2015-11-15 DIAGNOSIS — O24112 Pre-existing diabetes mellitus, type 2, in pregnancy, second trimester: Secondary | ICD-10-CM

## 2015-11-15 DIAGNOSIS — Z369 Encounter for antenatal screening, unspecified: Secondary | ICD-10-CM

## 2015-11-15 DIAGNOSIS — IMO0001 Reserved for inherently not codable concepts without codable children: Secondary | ICD-10-CM

## 2015-11-15 DIAGNOSIS — O24113 Pre-existing diabetes mellitus, type 2, in pregnancy, third trimester: Secondary | ICD-10-CM

## 2015-11-15 LAB — POCT URINALYSIS DIPSTICK
Bilirubin, UA: NEGATIVE
Glucose, UA: NEGATIVE
Ketones, UA: NEGATIVE
Leukocytes, UA: NEGATIVE
Nitrite, UA: NEGATIVE
PROTEIN UA: NEGATIVE
RBC UA: NEGATIVE
SPEC GRAV UA: 1.01
UROBILINOGEN UA: NEGATIVE
pH, UA: 6

## 2015-11-15 NOTE — Progress Notes (Signed)
NONSTRESS TEST INTERPRETATION  INDICATIONS: Pre-existing type 2 Diabetes, and uncontrolled with ketoacidosis; AMA  FHR baseline: 130 RESULTS: reactive COMMENTS: No contractions or vaginal bleeding. B/P's during NST: B/P: 113/64, B/P: 123/66, B/P: 117/64   PLAN: 1. Continue fetal kick counts twice a day. 2. Continue antepartum testing as scheduled-Biweekly  Fenton Mallingebbie Shaniece Bussa, LPN

## 2015-11-15 NOTE — Progress Notes (Signed)
MFM consultation   35 yo g3P1011 at 32/5 weeks with T2DM on insulin. She reports decrease in her N/V.    Current regimen:  Lantus: AM 44u PM 52u  Humalog Breakfast/lunch/dinner: 14/16/18u    Fasting 117, 106, 111, 126, 112, 103, 110  Post breakfast 143-176 Post lunch 147-192 Post dinner 130-196  FHR 130s  1. Poor glycemic control--we addressed dietary changes--she does not feel she can decrease carbs further Plan:  Will increase AM lantus from 44 u to 48u  Increase PM lantus from 52 to 60u Humalog dosing: 14/16/18 with B/L/Dinner, respectively  Return to office in one week to review blood glucose values and NST/AFI Continue twice weekly antenatal testing  Given poor glycemic control, delivery should be planned for 37w  2. Tobacco use--continues to use 1/2ppd, feels she cannot decrease further due to stressors 3. Depression--reports celexa helping  Consultation time: 20minutes

## 2015-11-17 ENCOUNTER — Encounter: Payer: Medicaid Other | Admitting: Obstetrics and Gynecology

## 2015-11-17 ENCOUNTER — Other Ambulatory Visit: Payer: Medicaid Other

## 2015-11-22 ENCOUNTER — Inpatient Hospital Stay: Admission: RE | Admit: 2015-11-22 | Payer: Medicaid Other | Source: Ambulatory Visit

## 2015-11-22 ENCOUNTER — Other Ambulatory Visit: Payer: Self-pay

## 2015-11-22 ENCOUNTER — Other Ambulatory Visit: Payer: Medicaid Other

## 2015-11-22 ENCOUNTER — Ambulatory Visit: Payer: Medicaid Other

## 2015-11-22 DIAGNOSIS — O24913 Unspecified diabetes mellitus in pregnancy, third trimester: Secondary | ICD-10-CM

## 2015-11-22 DIAGNOSIS — O24013 Pre-existing diabetes mellitus, type 1, in pregnancy, third trimester: Secondary | ICD-10-CM

## 2015-11-26 ENCOUNTER — Telehealth: Payer: Self-pay | Admitting: Obstetrics and Gynecology

## 2015-11-26 NOTE — Telephone Encounter (Signed)
Pt called and has moved to [place]. I faxed her a release and she signed. I sent records to [employer] [place] Dr. Rondel BatonLeiberman.

## 2015-11-29 NOTE — Telephone Encounter (Signed)
Ok. Thank you.

## 2015-12-09 ENCOUNTER — Ambulatory Visit: Payer: Medicaid Other

## 2015-12-09 ENCOUNTER — Ambulatory Visit: Admission: RE | Admit: 2015-12-09 | Payer: Medicaid Other | Source: Ambulatory Visit

## 2016-05-05 ENCOUNTER — Encounter (HOSPITAL_COMMUNITY): Payer: Self-pay
